# Patient Record
Sex: Male | Born: 1985 | Race: Black or African American | Hispanic: No | Marital: Single | Smoking: Former smoker
Health system: Southern US, Community
[De-identification: ages and names within clinical notes are randomized; demographics above are authoritative.]

## PROBLEM LIST (undated history)

## (undated) DIAGNOSIS — J40 Bronchitis, not specified as acute or chronic: Secondary | ICD-10-CM

---
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
## 2009-01-18 ENCOUNTER — Emergency Department (HOSPITAL_COMMUNITY): Admission: EM | Admit: 2009-01-18 | Discharge: 2009-01-18 | Payer: Self-pay | Admitting: Emergency Medicine

## 2010-05-05 LAB — RAPID STREP SCREEN (MED CTR MEBANE ONLY): Streptococcus, Group A Screen (Direct): NEGATIVE

## 2012-07-12 ENCOUNTER — Encounter (HOSPITAL_COMMUNITY): Payer: Self-pay

## 2012-07-12 ENCOUNTER — Emergency Department (INDEPENDENT_AMBULATORY_CARE_PROVIDER_SITE_OTHER): Payer: BC Managed Care – PPO

## 2012-07-12 ENCOUNTER — Emergency Department (HOSPITAL_COMMUNITY)
Admission: EM | Admit: 2012-07-12 | Discharge: 2012-07-12 | Disposition: A | Discharge status: Home / Self Care | Payer: BC Managed Care – PPO | Source: Home / Self Care | Attending: Emergency Medicine | Admitting: Emergency Medicine

## 2012-07-12 DIAGNOSIS — R0789 Other chest pain: Secondary | ICD-10-CM

## 2012-07-12 DIAGNOSIS — J40 Bronchitis, not specified as acute or chronic: Secondary | ICD-10-CM

## 2012-07-12 DIAGNOSIS — R071 Chest pain on breathing: Secondary | ICD-10-CM

## 2012-07-12 MED ORDER — IBUPROFEN 800 MG PO TABS: 800.0000 mg | ORAL_TABLET | Freq: Three times a day (TID) | ORAL | Status: DC

## 2012-07-12 MED ORDER — ALBUTEROL SULFATE HFA 108 (90 BASE) MCG/ACT IN AERS: 2.0000 | INHALATION_SPRAY | RESPIRATORY_TRACT | Status: DC | PRN

## 2012-07-12 NOTE — ED Provider Notes (Signed)
Medical screening examination/treatment/procedure(s) were performed by non-physician practitioner and as supervising physician I was immediately available for consultation/collaboration.  Raynald Blend, MD 07/12/12 1346

## 2012-07-12 NOTE — ED Provider Notes (Signed)
History     CSN: [id number]  Arrival date & time 07/12/12  1149   First MD Initiated Contact with Patient 07/12/12 1306      Chief Complaint  Patient presents with  . Muscle Pain    (Consider location/radiation/quality/duration/timing/severity/associated sxs/prior treatment) Patient is a 27 y.o. male presenting with cough. The history is provided by the patient. No language interpreter was used.  Cough Cough characteristics:  Productive Sputum characteristics:  Nondescript Severity:  Mild Timing:  Constant Progression:  Worsening Chronicity:  New Relieved by:  Nothing Worsened by:  Nothing tried Associated symptoms: chest pain   Pt reports that he is having soreness and pain in his chest.  Full chest, achy,  phelgm in am.  Pt is a smoker.  History reviewed. No pertinent past medical history.  History reviewed. No pertinent past surgical history.  History reviewed. No pertinent family history.  History  Substance Use Topics  . Smoking status: Not on file  . Smokeless tobacco: Not on file  . Alcohol Use: Not on file      Review of Systems  Respiratory: Positive for cough.   Cardiovascular: Positive for chest pain.  All other systems reviewed and are negative.    Allergies  Review of patient's allergies indicates no known allergies.  Home Medications  No current outpatient prescriptions on file.  BP 122/79  Pulse 84  Temp(Src) 98.6 F (37 C) (Oral)  Resp 16  SpO2 100%  Physical Exam  Nursing note and vitals reviewed. Constitutional: He is oriented to person, place, and time. He appears well-developed and well-nourished.  HENT:  Head: Normocephalic and atraumatic.  Right Ear: External ear normal.  Left Ear: External ear normal.  Mouth/Throat: Oropharynx is clear and moist.  Eyes: Conjunctivae and EOM are normal. Pupils are equal, round, and reactive to light.  Neck: Normal range of motion. Neck supple.  Cardiovascular: Normal rate and normal heart  sounds.   Pulmonary/Chest: Effort normal and breath sounds normal.  Musculoskeletal: Normal range of motion.  Neurological: He is alert and oriented to person, place, and time. He has normal reflexes.  Skin: Skin is warm.  Psychiatric: He has a normal mood and affect.    ED Course  Procedures (including critical care time)  Labs Reviewed - No data to display No results found.  Chest xray normal,   Possible viral bronchitis,   I will treat with ibuprofen and albuterol inhaler. 1. Bronchitis   2. Chest wall pain       MDM       Elson Areas, PA-C 07/12/12 1336  Lonia Skinner [place], [place] 07/12/12 1341

## 2012-07-12 NOTE — ED Notes (Signed)
C/o 3 day duration of pain in sternal/epigastric area , some worse w direct palpation or deep breathing, some better w burping ; worse when he lays down. Denies SOB, sweats, radiation of discomfort, dizzy ; W/D/colr good, NAD

## 2013-03-02 ENCOUNTER — Emergency Department (HOSPITAL_COMMUNITY)
Admission: EM | Admit: 2013-03-02 | Discharge: 2013-03-02 | Disposition: A | Discharge status: Home / Self Care | Payer: BC Managed Care – PPO | Attending: Emergency Medicine | Admitting: Emergency Medicine

## 2013-03-02 ENCOUNTER — Emergency Department (HOSPITAL_COMMUNITY): Payer: BC Managed Care – PPO

## 2013-03-02 ENCOUNTER — Encounter (HOSPITAL_COMMUNITY): Payer: Self-pay | Admitting: Emergency Medicine

## 2013-03-02 DIAGNOSIS — R141 Gas pain: Secondary | ICD-10-CM | POA: Insufficient documentation

## 2013-03-02 DIAGNOSIS — R142 Eructation: Secondary | ICD-10-CM | POA: Insufficient documentation

## 2013-03-02 DIAGNOSIS — R079 Chest pain, unspecified: Secondary | ICD-10-CM | POA: Insufficient documentation

## 2013-03-02 DIAGNOSIS — Z8709 Personal history of other diseases of the respiratory system: Secondary | ICD-10-CM | POA: Insufficient documentation

## 2013-03-02 DIAGNOSIS — Z87891 Personal history of nicotine dependence: Secondary | ICD-10-CM | POA: Insufficient documentation

## 2013-03-02 DIAGNOSIS — R143 Flatulence: Secondary | ICD-10-CM

## 2013-03-02 DIAGNOSIS — F411 Generalized anxiety disorder: Secondary | ICD-10-CM | POA: Insufficient documentation

## 2013-03-02 HISTORY — DX: Bronchitis, not specified as acute or chronic: J40

## 2013-03-02 MED ORDER — ONDANSETRON 4 MG PO TBDP
4.0000 mg | ORAL_TABLET | Freq: Once | ORAL | Status: AC
  Administered 2013-03-02: 4 mg via ORAL
  Filled 2013-03-02: qty 1

## 2013-03-02 MED ORDER — GI COCKTAIL ~~LOC~~
30.0000 mL | Freq: Once | ORAL | Status: AC
  Administered 2013-03-02: 30 mL via ORAL
  Filled 2013-03-02: qty 30

## 2013-03-02 MED ORDER — FAMOTIDINE 20 MG PO TABS: 20.0000 mg | ORAL_TABLET | Freq: Two times a day (BID) | ORAL | Status: DC

## 2013-03-02 MED ORDER — HYDROMORPHONE HCL PF 1 MG/ML IJ SOLN
1.0000 mg | Freq: Once | INTRAMUSCULAR | Status: AC
  Administered 2013-03-02: 1 mg via INTRAMUSCULAR
  Filled 2013-03-02: qty 1

## 2013-03-02 NOTE — Discharge Instructions (Signed)
Chest Pain (Nonspecific) °It is often hard to give a specific diagnosis for the cause of chest pain. There is always a chance that your pain could be related to something serious, such as a heart attack or a blood clot in the lungs. You need to follow up with your caregiver for further evaluation. °CAUSES  °· Heartburn. °· Pneumonia or bronchitis. °· Anxiety or stress. °· Inflammation around your heart (pericarditis) or lung (pleuritis or pleurisy). °· A blood clot in the lung. °· A collapsed lung (pneumothorax). It can develop suddenly on its own (spontaneous pneumothorax) or from injury (trauma) to the chest. °· Shingles infection (herpes zoster virus). °The chest wall is composed of bones, muscles, and cartilage. Any of these can be the source of the pain. °· The bones can be bruised by injury. °· The muscles or cartilage can be strained by coughing or overwork. °· The cartilage can be affected by inflammation and become sore (costochondritis). °DIAGNOSIS  °Lab tests or other studies, such as X-rays, electrocardiography, stress testing, or cardiac imaging, may be needed to find the cause of your pain.  °TREATMENT  °· Treatment depends on what may be causing your chest pain. Treatment may include: °· Acid blockers for heartburn. °· Anti-inflammatory medicine. °· Pain medicine for inflammatory conditions. °· Antibiotics if an infection is present. °· You may be advised to change lifestyle habits. This includes stopping smoking and avoiding alcohol, caffeine, and chocolate. °· You may be advised to keep your head raised (elevated) when sleeping. This reduces the chance of acid going backward from your stomach into your esophagus. °· Most of the time, nonspecific chest pain will improve within 2 to 3 days with rest and mild pain medicine. °HOME CARE INSTRUCTIONS  °· If antibiotics were prescribed, take your antibiotics as directed. Finish them even if you start to feel better. °· For the next few days, avoid physical  activities that bring on chest pain. Continue physical activities as directed. °· Do not smoke. °· Avoid drinking alcohol. °· Only take over-the-counter or prescription medicine for pain, discomfort, or fever as directed by your caregiver. °· Follow your caregiver's suggestions for further testing if your chest pain does not go away. °· Keep any follow-up appointments you made. If you do not go to an appointment, you could develop lasting (chronic) problems with pain. If there is any problem keeping an appointment, you must call to reschedule. °SEEK MEDICAL CARE IF:  °· You think you are having problems from the medicine you are taking. Read your medicine instructions carefully. °· Your chest pain does not go away, even after treatment. °· You develop a rash with blisters on your chest. °SEEK IMMEDIATE MEDICAL CARE IF:  °· You have increased chest pain or pain that spreads to your arm, neck, jaw, back, or abdomen. °· You develop shortness of breath, an increasing cough, or you are coughing up blood. °· You have severe back or abdominal pain, feel nauseous, or vomit. °· You develop severe weakness, fainting, or chills. °· You have a fever. °THIS IS AN EMERGENCY. Do not wait to see if the pain will go away. Get medical help at once. Call your local emergency services (911 in [place] Do not drive yourself to the hospital. °MAKE SURE YOU:  °· Understand these instructions. °· Will watch your condition. °· Will get help right away if you are not doing well or get worse. °Document Released: 10/29/2004 Document Revised: 04/13/2011 Document Reviewed: 08/25/2007 °[employer]® Patient Information ©2014 [employer]   [employer]. ° °

## 2013-03-02 NOTE — ED Provider Notes (Signed)
CSN: [id number]     Arrival date & time 03/02/13  0446 History   First MD Initiated Contact with Patient 03/02/13 0541     Chief Complaint  Patient presents with  . Chest Pain   (Consider location/radiation/quality/duration/timing/severity/associated sxs/prior Treatment) HPI History provided by patient. Sharp epigastric pain onset while sleeping radiates to mid chest. Has associated belching and sour taste in his mouth. Patient thought this was indigestion and took some TUMS and Gas-X without relief. He became concerned when the symptoms persisted and presents here for evaluation. He had been smoking marijuana and admits to some anxiety about his symptoms. No cough. No shortness of breath. No fevers. No back pain. No history of same.  Past Medical History  Diagnosis Date  . Bronchitis    History reviewed. No pertinent past surgical history. No family history on file. History  Substance Use Topics  . Smoking status: Former Games developermoker  . Smokeless tobacco: Never Used  . Alcohol Use: 4.2 oz/week    7 Cans of beer per week    Review of Systems  Constitutional: Negative for fever and chills.  Eyes: Negative for visual disturbance.  Respiratory: Negative for cough and wheezing.   Cardiovascular: Positive for chest pain.  Gastrointestinal: Negative for vomiting and blood in stool.  Musculoskeletal: Negative for back pain.  Skin: Negative for rash.  Neurological: Negative for weakness and headaches.  All other systems reviewed and are negative.    Allergies  Review of patient's allergies indicates no known allergies.  Home Medications   Current Outpatient Rx  Name  Route  Sig  Dispense  Refill  . albuterol (PROVENTIL HFA;VENTOLIN HFA) 108 (90 BASE) MCG/ACT inhaler   Inhalation   Inhale 2 puffs into the lungs every 4 (four) hours as needed for wheezing.   1 Inhaler   0    BP 107/91  Pulse 75  Temp(Src) 98 F (36.7 C) (Oral)  Resp 17  Ht 5\' 10"  (1.778 m)  Wt 175 lb (79.379  kg)  BMI 25.11 kg/m2  SpO2 100% Physical Exam  Constitutional: He is oriented to person, place, and time. He appears well-developed and well-nourished.  HENT:  Head: Normocephalic and atraumatic.  Mouth/Throat: Oropharynx is clear and moist. No oropharyngeal exudate.  Eyes: Conjunctivae and EOM are normal. Pupils are equal, round, and reactive to light.  Neck: Trachea normal. Neck supple. No thyromegaly present.  Cardiovascular: Normal rate, regular rhythm, S1 normal, S2 normal and normal pulses.     No systolic murmur is present   No diastolic murmur is present  Pulses:      Radial pulses are 2+ on the right side, and 2+ on the left side.  Pulmonary/Chest: Effort normal and breath sounds normal. He has no wheezes. He has no rhonchi. He has no rales. He exhibits no tenderness.  Abdominal: Soft. Normal appearance and bowel sounds are normal. There is no tenderness. There is no CVA tenderness and negative Murphy's sign.  Musculoskeletal:  Calves nontender, no cords or erythema  Neurological: He is alert and oriented to person, place, and time. He has normal strength. No cranial nerve deficit or sensory deficit. GCS eye subscore is 4. GCS verbal subscore is 5. GCS motor subscore is 6.  Skin: Skin is warm and dry. No rash noted. He is not diaphoretic.  Psychiatric: His speech is normal.  Cooperative and appropriate    ED Course  Procedures (including critical care time) Labs Review Labs Reviewed - No data to display Imaging Review  Dg Chest 2 View  03/02/2013   CLINICAL DATA:  Sudden onset of mid chest pain this morning.  EXAM: CHEST  2 VIEW  COMPARISON:  07/12/2012  FINDINGS: Heart size and vascularity are normal and the lungs are clear. No acute osseous abnormality. Slight thoracolumbar scoliosis, unchanged.  IMPRESSION: No acute abnormality.   Electronically Signed   By: Geanie Cooley M.D.   On: 03/02/2013 06:57    EKG Interpretation    Date/Time:  Thursday March 02 2013 04:54:03  EST Ventricular Rate:  74 PR Interval:  152 QRS Duration: 85 QT Interval:  382 QTC Calculation: 424 R Axis:   64 Text Interpretation:  Sinus rhythm Nonspecific ST abnormality No old tracing to compare Confirmed by Dot Splinter  MD, Zymere Patlan ([id number]) on 03/02/2013 5:42:14 AM           GI cocktail and Zofran/ IM Dilaudid provided  On recheck is pain free and wants to go home, is low risk for ACS. Will treat will pepcid. PT agress to strict return precautions and is stable and appropriate for discharge at this time.   MDM  Diagnosis: Chest pain  Presentation suggests GERD Evaluated with screening EKG reviewed as above, no acute ischemia. Chest x-ray obtained and reviewed no pneumothorax and no infiltrate. Symptomatically improved with GI cocktail and medications Vital signs and nursing notes reviewed and considered   Sunnie Nielsen, MD 03/03/13 0001

## 2013-03-02 NOTE — ED Notes (Signed)
Pt states that when he awoke he took gas-x and 2 tums trying to relieve the pain with some relief.

## 2013-03-02 NOTE — ED Notes (Signed)
Pt arrives ambulatory via EMS c/o chest pain when woke up. Starts in epigastric and radiates up. Pain 8/10. VSS. EKG unremarkable. NSR.  Hx bronchitis last year. States he smoked marijuana last night. Pain increased with palpation.

## 2013-04-06 ENCOUNTER — Encounter (HOSPITAL_COMMUNITY): Payer: Self-pay | Admitting: Emergency Medicine

## 2013-04-06 ENCOUNTER — Emergency Department (HOSPITAL_COMMUNITY): Payer: BC Managed Care – PPO

## 2013-04-06 ENCOUNTER — Emergency Department (HOSPITAL_COMMUNITY)
Admission: EM | Admit: 2013-04-06 | Discharge: 2013-04-06 | Disposition: A | Discharge status: Home / Self Care | Payer: BC Managed Care – PPO | Attending: Emergency Medicine | Admitting: Emergency Medicine

## 2013-04-06 DIAGNOSIS — J069 Acute upper respiratory infection, unspecified: Secondary | ICD-10-CM | POA: Insufficient documentation

## 2013-04-06 DIAGNOSIS — Z79899 Other long term (current) drug therapy: Secondary | ICD-10-CM | POA: Insufficient documentation

## 2013-04-06 DIAGNOSIS — Z87891 Personal history of nicotine dependence: Secondary | ICD-10-CM | POA: Insufficient documentation

## 2013-04-06 MED ORDER — HYDROCODONE-HOMATROPINE 5-1.5 MG/5ML PO SYRP: 5.0000 mL | ORAL_SOLUTION | Freq: Four times a day (QID) | ORAL | Status: DC | PRN

## 2013-04-06 MED ORDER — IBUPROFEN 800 MG PO TABS: 800.0000 mg | ORAL_TABLET | Freq: Three times a day (TID) | ORAL | Status: DC

## 2013-04-06 NOTE — ED Provider Notes (Signed)
CSN: [id number]     Arrival date & time 04/06/13  1800 History  This chart was scribed for non-physician practitioner Emilia BeckKaitlyn Maryiah Olvey, PA-C working with Layla MawKristen N Ward, DO by Joaquin MusicKristina Sanchez-Matthews, ED Scribe. This patient was seen in room TR05C/TR05C and the patient's care was started at 9:20 PM .   Chief Complaint  Patient presents with  . Influenza   Patient is a 28 y.o. male presenting with flu symptoms. The history is provided by the patient. No language interpreter was used.  Influenza Presenting symptoms: cough, fever and sore throat    HPI Comments: Stephen Warner is a 28 y.o. male who presents to the Emergency Department complaining of ongoing worsening sore throat, fever, productive cough and emesis that began 3 days ago.Pt states he has progressively worsened since his sx initially began.  Pt states he has not had an episode of emesis since 3 days ago. He states he has been able to tolerate fluids. Pt states he has taken OTC Tylenol & Dayquil with little relief. Pt states he has recent sick contacts with his children. Pt denies having a sore throat at this moment.  Past Medical History  Diagnosis Date  . Bronchitis    History reviewed. No pertinent past surgical history. History reviewed. No pertinent family history. History  Substance Use Topics  . Smoking status: Former Games developermoker  . Smokeless tobacco: Never Used  . Alcohol Use: 4.2 oz/week    7 Cans of beer per week    Review of Systems  Constitutional: Positive for fever.  HENT: Positive for sore throat.   Respiratory: Positive for cough.   All other systems reviewed and are negative.    Allergies  Review of patient's allergies indicates no known allergies.  Home Medications   Current Outpatient Rx  Name  Route  Sig  Dispense  Refill  . acetaminophen (TYLENOL) 500 MG tablet   Oral   Take 1,000 mg by mouth every 6 (six) hours as needed for moderate pain.         Marland Kitchen. albuterol (PROVENTIL HFA;VENTOLIN HFA) 108  (90 BASE) MCG/ACT inhaler   Inhalation   Inhale 2 puffs into the lungs every 4 (four) hours as needed for wheezing.   1 Inhaler   0   . Aspirin Effervescent (ALKA-SELTZER PO)   Oral   Take 2 tablets by mouth once.          BP 128/73  Pulse 89  Temp(Src) 98.8 F (37.1 C) (Oral)  Resp 17  SpO2 100%  Physical Exam  Nursing note and vitals reviewed. Constitutional: He is oriented to person, place, and time. He appears well-developed and well-nourished. No distress.  HENT:  Head: Normocephalic and atraumatic.  Mouth/Throat: No oropharyngeal exudate.  Eyes: EOM are normal. Pupils are equal, round, and reactive to light.  Neck: Neck supple. No tracheal deviation present.  Cardiovascular: Normal rate, regular rhythm and normal heart sounds.  Exam reveals no gallop and no friction rub.   No murmur heard. Pulmonary/Chest: Effort normal and breath sounds normal. No respiratory distress. He has no wheezes. He has no rales. He exhibits no tenderness.  Musculoskeletal: Normal range of motion.  Neurological: He is alert and oriented to person, place, and time.  Skin: Skin is warm and dry.  Psychiatric: He has a normal mood and affect. His behavior is normal.    ED Course  Procedures  DIAGNOSTIC STUDIES: Oxygen Saturation is 100% on RA, normal by my interpretation.    COORDINATION OF  CARE: 9:24 PM-Discussed treatment plan which includes discuss radiology findings and will discharge pt with Ibuprofen and Hycodan. Pt agreed to plan.   Labs Review Labs Reviewed - No data to display Imaging Review Dg Chest 2 View  04/06/2013   CLINICAL DATA:  Cough for past week.  EXAM: CHEST  2 VIEW  COMPARISON:  03/02/2013.  FINDINGS: Minimal peribronchial thickening may be normal versus minimal bronchitis type changes.  No infiltrate, congestive heart failure or pneumothorax.  Heart size within normal limits.  Mild curvature thoracic spine.  IMPRESSION: Minimal peribronchial thickening.  Please see  above.   Electronically Signed   By: Bridgett Larsson M.D.   On: 04/06/2013 20:24     EKG Interpretation None     MDM   Final diagnoses:  URI (upper respiratory infection)    9:29 PM Chest xray unremarkable for acute changes. Vitals stable and patient afebrile. Patient will be discharged with hycodan and ibuprofen for symptoms. Patient advised to return with worsening or concerning symptoms.   I personally performed the services described in this documentation, which was scribed in my presence. The recorded information has been reviewed and is accurate.    Emilia Beck, [place] 04/06/13 2130

## 2013-04-06 NOTE — ED Notes (Signed)
Per pt sts fever, sore throat, sts vomiting Tuesday. sts hasn't vomited since Tuesday. sts keeping fluids down and taking tylenol.

## 2013-04-06 NOTE — ED Provider Notes (Signed)
Medical screening examination/treatment/procedure(s) were performed by non-physician practitioner and as supervising physician I was immediately available for consultation/collaboration.   EKG Interpretation None        Kristen N Ward, DO 04/06/13 2340 

## 2013-04-06 NOTE — Discharge Instructions (Signed)
Take hycodan as needed for cough. Take ibuprofen as needed for fever. Refer to attached documents for more information.

## 2013-12-21 ENCOUNTER — Encounter (HOSPITAL_COMMUNITY): Payer: Self-pay | Admitting: *Deleted

## 2013-12-21 ENCOUNTER — Emergency Department (HOSPITAL_COMMUNITY)
Admission: EM | Admit: 2013-12-21 | Discharge: 2013-12-21 | Disposition: A | Discharge status: Home / Self Care | Payer: BC Managed Care – PPO | Source: Home / Self Care | Attending: Family Medicine | Admitting: Family Medicine

## 2013-12-21 DIAGNOSIS — J0140 Acute pansinusitis, unspecified: Secondary | ICD-10-CM

## 2013-12-21 MED ORDER — FLUTICASONE PROPIONATE 50 MCG/ACT NA SUSP: 2.0000 | Freq: Every day | NASAL | Status: DC

## 2013-12-21 MED ORDER — AMOXICILLIN-POT CLAVULANATE 875-125 MG PO TABS: 1.0000 | ORAL_TABLET | Freq: Two times a day (BID) | ORAL | Status: DC

## 2013-12-21 MED ORDER — IPRATROPIUM BROMIDE 0.06 % NA SOLN: 2.0000 | Freq: Four times a day (QID) | NASAL | Status: DC

## 2013-12-21 NOTE — ED Notes (Signed)
C/o cold symptoms onset 1 week ago, but still has pressure around his eyes, headache, nasal congestion yellowish.  Had fever night before last.  He took 2 Tylenol.

## 2013-12-21 NOTE — ED Provider Notes (Signed)
CSN: [id number]     Arrival date & time 12/21/13  1533 History   None    Chief Complaint  Patient presents with  . URI   (Consider location/radiation/quality/duration/timing/severity/associated sxs/prior Treatment) HPI  9 days ago developed HA. Developed increased mucus production and sinus pain and pressure. Tylenol sinus w/o much benefit. Home remedies w/o benefit. Subjective fever. Denies CP, SOB, nausea, vomiting, diarrhea. Not getting better.     Past Medical History  Diagnosis Date  . Bronchitis    History reviewed. No pertinent past surgical history. History reviewed. No pertinent family history. History  Substance Use Topics  . Smoking status: Former Smoker    Types: Cigars    Quit date: 02/02/2013  . Smokeless tobacco: Never Used  . Alcohol Use: No    Review of Systems Per HPI with all other pertinent systems negative.   Allergies  Review of patient's allergies indicates no known allergies.  Home Medications   Prior to Admission medications   Medication Sig Start Date End Date Taking? Authorizing Provider  acetaminophen (TYLENOL) 500 MG tablet Take 1,000 mg by mouth every 6 (six) hours as needed for moderate pain.   Yes Historical Provider, MD  Aspirin Effervescent (ALKA-SELTZER PO) Take 2 tablets by mouth once.   Yes Historical Provider, MD  albuterol (PROVENTIL HFA;VENTOLIN HFA) 108 (90 BASE) MCG/ACT inhaler Inhale 2 puffs into the lungs every 4 (four) hours as needed for wheezing. 07/12/12   Elson AreasLeslie K Sofia, PA-C  amoxicillin-clavulanate (AUGMENTIN) 875-125 MG per tablet Take 1 tablet by mouth 2 (two) times daily. 12/21/13   Ozella Rocksavid J Merrell, MD  fluticasone (FLONASE) 50 MCG/ACT nasal spray Place 2 sprays into both nostrils at bedtime. 12/21/13   Ozella Rocksavid J Merrell, MD  HYDROcodone-homatropine [facility](HYCODAN) 5-1.5 MG/5ML syrup Take 5 mLs by mouth every 6 (six) hours as needed. 04/06/13   Kaitlyn Szekalski, PA-C  ibuprofen (ADVIL,MOTRIN) 800 MG tablet Take 1 tablet (800 mg  total) by mouth 3 (three) times daily. 04/06/13   Kaitlyn Szekalski, PA-C  ipratropium (ATROVENT) 0.06 % nasal spray Place 2 sprays into both nostrils 4 (four) times daily. 12/21/13   Ozella Rocksavid J Merrell, MD   BP 117/67 mmHg  Pulse 80  Temp(Src) 98.1 F (36.7 C) (Oral)  Resp 12  SpO2 100% Physical Exam  Constitutional: He appears well-developed and well-nourished.  HENT:  Frontal and maxillary sinuses full  Eyes: EOM are normal. Pupils are equal, round, and reactive to light.  Neck: Normal range of motion.  Cardiovascular: Normal rate and normal heart sounds.   Pulmonary/Chest: Effort normal.  Abdominal: Soft. He exhibits no distension.  Musculoskeletal: Normal range of motion. He exhibits no tenderness.  Skin: Skin is warm and dry.  Psychiatric: He has a normal mood and affect. His behavior is normal. Thought content normal.    ED Course  Procedures (including critical care time) Labs Review Labs Reviewed - No data to display  Imaging Review No results found.   MDM   1. Acute pansinusitis, recurrence not specified    Augmentin Nasal atrovent Flonase  Motrin Precautions given and all questions answered  Shelly Flattenavid Merrell, MD Family Medicine 12/21/2013, 4:47 PM      Ozella Rocksavid J Merrell, MD 12/21/13 [phone]

## 2014-04-23 ENCOUNTER — Emergency Department (HOSPITAL_COMMUNITY)
Admission: EM | Admit: 2014-04-23 | Discharge: 2014-04-23 | Disposition: A | Discharge status: Home / Self Care | Payer: Self-pay | Attending: Emergency Medicine | Admitting: Emergency Medicine

## 2014-04-23 ENCOUNTER — Encounter (HOSPITAL_COMMUNITY): Payer: Self-pay | Admitting: Family Medicine

## 2014-04-23 ENCOUNTER — Emergency Department (HOSPITAL_COMMUNITY): Payer: Self-pay

## 2014-04-23 DIAGNOSIS — R55 Syncope and collapse: Secondary | ICD-10-CM | POA: Insufficient documentation

## 2014-04-23 DIAGNOSIS — Z8709 Personal history of other diseases of the respiratory system: Secondary | ICD-10-CM | POA: Insufficient documentation

## 2014-04-23 DIAGNOSIS — Z79899 Other long term (current) drug therapy: Secondary | ICD-10-CM | POA: Insufficient documentation

## 2014-04-23 DIAGNOSIS — R002 Palpitations: Secondary | ICD-10-CM | POA: Insufficient documentation

## 2014-04-23 DIAGNOSIS — Z87891 Personal history of nicotine dependence: Secondary | ICD-10-CM | POA: Insufficient documentation

## 2014-04-23 LAB — I-STAT TROPONIN, ED: Troponin i, poc: 0 ng/mL (ref 0.00–0.08)

## 2014-04-23 LAB — CBC
HEMATOCRIT: 41.3 % (ref 39.0–52.0)
Hemoglobin: 13.4 g/dL (ref 13.0–17.0)
MCH: 29.8 pg (ref 26.0–34.0)
MCHC: 32.4 g/dL (ref 30.0–36.0)
MCV: 91.8 fL (ref 78.0–100.0)
Platelets: 260 10*3/uL (ref 150–400)
RBC: 4.5 MIL/uL (ref 4.22–5.81)
RDW: 11.7 % (ref 11.5–15.5)
WBC: 5.3 10*3/uL (ref 4.0–10.5)

## 2014-04-23 LAB — BASIC METABOLIC PANEL
ANION GAP: 8 (ref 5–15)
BUN: 16 mg/dL (ref 6–23)
CALCIUM: 9.2 mg/dL (ref 8.4–10.5)
CHLORIDE: 106 mmol/L (ref 96–112)
CO2: 26 mmol/L (ref 19–32)
Creatinine, Ser: 0.98 mg/dL (ref 0.50–1.35)
GFR calc Af Amer: >90 mL/min (ref >90)
GFR calc non Af Amer: >90 mL/min (ref >90)
Glucose, Bld: 87 mg/dL (ref 70–99)
POTASSIUM: 3.8 mmol/L (ref 3.5–5.1)
Sodium: 140 mmol/L (ref 135–145)

## 2014-04-23 NOTE — ED Notes (Signed)
Per pt sts he was walking into the kitchen last night and had a syncopal episode. sts wife found him in the kitchen. sts he hit had on the floor when he fell. sts his heart was beating fast.

## 2014-04-23 NOTE — ED Notes (Signed)
Pt transported to xray 

## 2014-04-23 NOTE — ED Provider Notes (Signed)
CSN: [id number]     Arrival date & time 04/23/14  1628 History   First MD Initiated Contact with Patient 04/23/14 1652     Chief Complaint  Patient presents with  . Loss of Consciousness  . Fall     (Consider location/radiation/quality/duration/timing/severity/associated sxs/prior Treatment) Patient is a 29 y.o. male presenting with syncope. The history is provided by the patient.  Loss of Consciousness Episode history:  Single Most recent episode:  Yesterday Timing:  Intermittent Progression:  Resolved Chronicity:  Recurrent Context comment:  After palpitations Witnessed: yes   Relieved by:  None tried Worsened by:  Nothing tried Ineffective treatments:  None tried Associated symptoms: palpitations   Associated symptoms: no chest pain and no shortness of breath   Risk factors: no coronary artery disease and no seizures     Past Medical History  Diagnosis Date  . Bronchitis    History reviewed. No pertinent past surgical history. History reviewed. No pertinent family history. History  Substance Use Topics  . Smoking status: Former Smoker    Types: Cigars    Quit date: 02/02/2013  . Smokeless tobacco: Never Used  . Alcohol Use: No    Review of Systems  Respiratory: Negative for cough, chest tightness and shortness of breath.   Cardiovascular: Positive for palpitations and syncope. Negative for chest pain.  All other systems reviewed and are negative.     Allergies  Review of patient's allergies indicates no known allergies.  Home Medications   Prior to Admission medications   Medication Sig Start Date End Date Taking? Authorizing Provider  acetaminophen (TYLENOL) 500 MG tablet Take 1,000 mg by mouth every 6 (six) hours as needed for moderate pain.    Historical Provider, MD  albuterol (PROVENTIL HFA;VENTOLIN HFA) 108 (90 BASE) MCG/ACT inhaler Inhale 2 puffs into the lungs every 4 (four) hours as needed for wheezing. 07/12/12   Elson AreasLeslie K Sofia, PA-C   amoxicillin-clavulanate (AUGMENTIN) 875-125 MG per tablet Take 1 tablet by mouth 2 (two) times daily. 12/21/13   Ozella Rocksavid J Merrell, MD  Aspirin Effervescent (ALKA-SELTZER PO) Take 2 tablets by mouth once.    Historical Provider, MD  fluticasone (FLONASE) 50 MCG/ACT nasal spray Place 2 sprays into both nostrils at bedtime. 12/21/13   Ozella Rocksavid J Merrell, MD  HYDROcodone-homatropine [facility](HYCODAN) 5-1.5 MG/5ML syrup Take 5 mLs by mouth every 6 (six) hours as needed. 04/06/13   Kaitlyn Szekalski, PA-C  ibuprofen (ADVIL,MOTRIN) 800 MG tablet Take 1 tablet (800 mg total) by mouth 3 (three) times daily. 04/06/13   Kaitlyn Szekalski, PA-C  ipratropium (ATROVENT) 0.06 % nasal spray Place 2 sprays into both nostrils 4 (four) times daily. 12/21/13   Ozella Rocksavid J Merrell, MD   BP 113/76 mmHg  Pulse 79  Temp(Src) 97.8 F (36.6 C) (Oral)  Resp 21  Ht 5\' 10"  (1.778 m)  Wt 175 lb 3.2 oz (79.47 kg)  BMI 25.14 kg/m2  SpO2 96% Physical Exam  Constitutional: He is oriented to person, place, and time. He appears well-developed and well-nourished. No distress.  HENT:  Head: Normocephalic and atraumatic.  Mouth/Throat: Oropharynx is clear and moist. No oropharyngeal exudate.  Eyes: Conjunctivae and EOM are normal. Pupils are equal, round, and reactive to light.  Neck: Normal range of motion. Neck supple.  Cardiovascular: Normal rate, regular rhythm, normal heart sounds and intact distal pulses.  Exam reveals no gallop and no friction rub.   No murmur heard. Pulmonary/Chest: Effort normal and breath sounds normal. No respiratory distress. He has no  wheezes. He has no rales.  Abdominal: Soft. He exhibits no distension and no mass. There is no tenderness. There is no rebound and no guarding.  Musculoskeletal: Normal range of motion. He exhibits no edema or tenderness.  Lymphadenopathy:    He has no cervical adenopathy.  Neurological: He is alert and oriented to person, place, and time. No cranial nerve deficit.  Skin: Skin is  warm and dry. No rash noted. He is not diaphoretic.  Psychiatric: He has a normal mood and affect. His behavior is normal. Judgment and thought content normal.  Nursing note and vitals reviewed.   ED Course  Procedures (including critical care time) Labs Review Labs Reviewed  CBC  BASIC METABOLIC PANEL  Rosezena Sensor, ED    Imaging Review Dg Chest 2 View  04/23/2014   CLINICAL DATA:  Tachycardia and syncope for 1 day.  EXAM: CHEST  2 VIEW  COMPARISON:  04/06/2013  FINDINGS: The heart size and mediastinal contours are within normal limits. Both lungs are clear. The visualized skeletal structures are unremarkable.  IMPRESSION: Normal chest x-ray.   Electronically Signed   By: Rudie Meyer M.D.   On: 04/23/2014 18:21     EKG Interpretation   Date/Time:  Monday April 23 2014 16:42:01 EDT Ventricular Rate:  89 PR Interval:  130 QRS Duration: 86 QT Interval:  348 QTC Calculation: 423 R Axis:   71 Text Interpretation:  Normal sinus rhythm T wave abnormality, consider  inferior ischemia Abnormal ECG change from previous tracing Confirmed by  BEATON  MD, ROBERT ([id number]) on 04/23/2014 4:45:59 PM      MDM   Final diagnoses:  Syncope, unspecified syncope type    29 year old male presents with syncope concerning for cardiogenic etiology. He has had multiple episodes over the last few years preceded by palpitations which led to shortness of breath and dizziness. He then syncopized. Last episode was yesterday. No obvious traumatic injuries and he has no symptoms today. Given 24 hours of observation and nonfocal neuro exam, no imaging performed. All signs are reassuring, EKG without arrhythmia, ischemia, change in intervals, long QTC, Brugada. No appreciable heart murmurs. No evidence of HOCM on EKG. The patient needs cardiology follow-up and likely event monitor. Will discuss with cardiology to arrange.  Laboratory workup reassuring. Chest x-ray without consolidation, effusion, widened  mediastinum, pneumothorax. Discussed with cardiology and we will set up follow-up for further workup and event monitoring. Referral given.  Dorna Leitz, MD 04/23/14 Barry Brunner  Nelva Nay, MD 04/28/14 [phone]

## 2014-04-23 NOTE — Discharge Instructions (Signed)
Syncope °Syncope is a medical term for fainting or passing out. This means you lose consciousness and drop to the ground. People are generally unconscious for less than 5 minutes. You may have some muscle twitches for up to 15 seconds before waking up and returning to normal. Syncope occurs more often in older adults, but it can happen to anyone. While most causes of syncope are not dangerous, syncope can be a sign of a serious medical problem. It is important to seek medical care.  °CAUSES  °Syncope is caused by a sudden drop in blood flow to the brain. The specific cause is often not determined. Factors that can bring on syncope include: °· Taking medicines that lower blood pressure. °· Sudden changes in posture, such as standing up quickly. °· Taking more medicine than prescribed. °· Standing in one place for too long. °· Seizure disorders. °· Dehydration and excessive exposure to heat. °· Low blood sugar (hypoglycemia). °· Straining to have a bowel movement. °· Heart disease, irregular heartbeat, or other circulatory problems. °· Fear, emotional distress, seeing blood, or severe pain. °SYMPTOMS  °Right before fainting, you may: °· Feel dizzy or light-headed. °· Feel nauseous. °· See all white or all black in your field of vision. °· Have cold, clammy skin. °DIAGNOSIS  °Your health care provider will ask about your symptoms, perform a physical exam, and perform an electrocardiogram (ECG) to record the electrical activity of your heart. Your health care provider may also perform other heart or blood tests to determine the cause of your syncope which may include: °· Transthoracic echocardiogram (TTE). During echocardiography, sound waves are used to evaluate how blood flows through your heart. °· Transesophageal echocardiogram (TEE). °· Cardiac monitoring. This allows your health care provider to monitor your heart rate and rhythm in real time. °· Holter monitor. This is a portable device that records your  heartbeat and can help diagnose heart arrhythmias. It allows your health care provider to track your heart activity for several days, if needed. °· Stress tests by exercise or by giving medicine that makes the heart beat faster. °TREATMENT  °In most cases, no treatment is needed. Depending on the cause of your syncope, your health care provider may recommend changing or stopping some of your medicines. °HOME CARE INSTRUCTIONS °· Have someone stay with you until you feel stable. °· Do not drive, use machinery, or play sports until your health care provider says it is okay. °· Keep all follow-up appointments as directed by your health care provider. °· Lie down right away if you start feeling like you might faint. Breathe deeply and steadily. Wait until all the symptoms have passed. °· Drink enough fluids to keep your urine clear or pale yellow. °· If you are taking blood pressure or heart medicine, get up slowly and take several minutes to sit and then stand. This can reduce dizziness. °SEEK IMMEDIATE MEDICAL CARE IF:  °· You have a severe headache. °· You have unusual pain in the chest, abdomen, or back. °· You are bleeding from your mouth or rectum, or you have black or tarry stool. °· You have an irregular or very fast heartbeat. °· You have pain with breathing. °· You have repeated fainting or seizure-like jerking during an episode. °· You faint when sitting or lying down. °· You have confusion. °· You have trouble walking. °· You have severe weakness. °· You have vision problems. °If you fainted, call your local emergency services (911 in [place] Do not drive   yourself to the hospital.  °MAKE SURE YOU: °· Understand these instructions. °· Will watch your condition. °· Will get help right away if you are not doing well or get worse. °Document Released: 01/19/2005 Document Revised: 01/24/2013 Document Reviewed: 03/20/2011 °[employer]® Patient Information ©2015 [employer]. This information is not intended to replace  advice given to you by your health care provider. Make sure you discuss any questions you have with your health care provider. ° °

## 2014-06-07 ENCOUNTER — Encounter: Payer: Self-pay | Admitting: Cardiovascular Disease

## 2014-06-07 NOTE — Progress Notes (Signed)
Opened in error

## 2014-06-18 ENCOUNTER — Encounter: Payer: Self-pay | Admitting: Cardiovascular Disease

## 2014-09-25 ENCOUNTER — Emergency Department (HOSPITAL_COMMUNITY)
Admission: EM | Admit: 2014-09-25 | Discharge: 2014-09-25 | Disposition: A | Discharge status: Home / Self Care | Payer: Self-pay | Attending: Emergency Medicine | Admitting: Emergency Medicine

## 2014-09-25 ENCOUNTER — Encounter (HOSPITAL_COMMUNITY): Payer: Self-pay | Admitting: Emergency Medicine

## 2014-09-25 ENCOUNTER — Emergency Department (HOSPITAL_COMMUNITY): Payer: Self-pay

## 2014-09-25 DIAGNOSIS — Y998 Other external cause status: Secondary | ICD-10-CM | POA: Insufficient documentation

## 2014-09-25 DIAGNOSIS — Z79899 Other long term (current) drug therapy: Secondary | ICD-10-CM | POA: Insufficient documentation

## 2014-09-25 DIAGNOSIS — W228XXA Striking against or struck by other objects, initial encounter: Secondary | ICD-10-CM | POA: Insufficient documentation

## 2014-09-25 DIAGNOSIS — Y9389 Activity, other specified: Secondary | ICD-10-CM | POA: Insufficient documentation

## 2014-09-25 DIAGNOSIS — Y9289 Other specified places as the place of occurrence of the external cause: Secondary | ICD-10-CM | POA: Insufficient documentation

## 2014-09-25 DIAGNOSIS — S60412A Abrasion of right middle finger, initial encounter: Secondary | ICD-10-CM | POA: Insufficient documentation

## 2014-09-25 DIAGNOSIS — Z7982 Long term (current) use of aspirin: Secondary | ICD-10-CM | POA: Insufficient documentation

## 2014-09-25 DIAGNOSIS — Z87891 Personal history of nicotine dependence: Secondary | ICD-10-CM | POA: Insufficient documentation

## 2014-09-25 DIAGNOSIS — Z8709 Personal history of other diseases of the respiratory system: Secondary | ICD-10-CM | POA: Insufficient documentation

## 2014-09-25 DIAGNOSIS — S60031A Contusion of right middle finger without damage to nail, initial encounter: Secondary | ICD-10-CM | POA: Insufficient documentation

## 2014-09-25 MED ORDER — CEPHALEXIN 500 MG PO CAPS: 500.0000 mg | ORAL_CAPSULE | Freq: Four times a day (QID) | ORAL | Status: DC

## 2014-09-25 MED ORDER — IBUPROFEN 400 MG PO TABS
800.0000 mg | ORAL_TABLET | Freq: Once | ORAL | Status: AC
  Administered 2014-09-25: 800 mg via ORAL
  Filled 2014-09-25: qty 2

## 2014-09-25 MED ORDER — IBUPROFEN 800 MG PO TABS: 800.0000 mg | ORAL_TABLET | Freq: Three times a day (TID) | ORAL | Status: DC

## 2014-09-25 MED ORDER — BACITRACIN ZINC 500 UNIT/GM EX OINT: 1.0000 "application " | TOPICAL_OINTMENT | Freq: Two times a day (BID) | CUTANEOUS | Status: DC

## 2014-09-25 NOTE — ED Provider Notes (Signed)
CSN: [id number]     Arrival date & time 09/25/14  1416 History  This chart was scribed for non-physician practitioner, Dierdre Forth, PA-C working with Melene Plan, DO, by Jarvis Morgan, ED Scribe. This patient was seen in room TR11C/TR11C and the patient's care was started at 2:48 PM.     Chief Complaint  Patient presents with  . Hand Pain    The history is provided by the patient and medical records. No language interpreter was used.    HPI Comments: Stephen Warner is a 29 y.o. male who presents to the Emergency Department complaining of constant, moderate, gradually worsening, right middle finger pain onset 5 days ago. Pt states he was cleaning his dishwasher and he shut his right hand in the dishwasher. Pt reports associated swelling to the tip of his right middle finger and redness. He notes he has a small abrasion over the DIP joint of his right hand. There is no active bleeding. He has been applying peroxide to the area with no relief. He has not taken anything for the pain. Pt is not on any anticoagulants. He denies any fever, chills, nausea, numbness, weakness, or sensation loss.    Past Medical History  Diagnosis Date  . Bronchitis    History reviewed. No pertinent past surgical history. History reviewed. No pertinent family history. Social History  Substance Use Topics  . Smoking status: Former Smoker    Types: Cigars    Quit date: 02/02/2013  . Smokeless tobacco: Never Used  . Alcohol Use: No    Review of Systems  Constitutional: Negative for fever and chills.  Gastrointestinal: Negative for nausea and vomiting.  Musculoskeletal: Positive for myalgias, joint swelling and arthralgias. Negative for back pain, neck pain and neck stiffness.  Skin: Positive for color change and wound (small abrasion over DIP joint of right middle finger).  Neurological: Negative for numbness.  Hematological: Does not bruise/bleed easily.  Psychiatric/Behavioral: The patient is not  nervous/anxious.   All other systems reviewed and are negative.     Allergies  Review of patient's allergies indicates no known allergies.  Home Medications   Prior to Admission medications   Medication Sig Start Date End Date Taking? Authorizing Provider  acetaminophen (TYLENOL) 500 MG tablet Take 1,000 mg by mouth every 6 (six) hours as needed for moderate pain.    Historical Provider, MD  albuterol (PROVENTIL HFA;VENTOLIN HFA) 108 (90 BASE) MCG/ACT inhaler Inhale 2 puffs into the lungs every 4 (four) hours as needed for wheezing. 07/12/12   Elson Areas, PA-C  amoxicillin-clavulanate (AUGMENTIN) 875-125 MG per tablet Take 1 tablet by mouth 2 (two) times daily. 12/21/13   Ozella Rocks, MD  Aspirin Effervescent (ALKA-SELTZER PO) Take 2 tablets by mouth once.    Historical Provider, MD  bacitracin ointment Apply 1 application topically 2 (two) times daily. 09/25/14   Atarah Cadogan, PA-C  cephALEXin (KEFLEX) 500 MG capsule Take 1 capsule (500 mg total) by mouth 4 (four) times daily. 09/25/14   Tzvi Economou, PA-C  fluticasone (FLONASE) 50 MCG/ACT nasal spray Place 2 sprays into both nostrils at bedtime. 12/21/13   Ozella Rocks, MD  HYDROcodone-homatropine [facility]) 5-1.5 MG/5ML syrup Take 5 mLs by mouth every 6 (six) hours as needed. 04/06/13   Kaitlyn Szekalski, PA-C  ibuprofen (ADVIL,MOTRIN) 800 MG tablet Take 1 tablet (800 mg total) by mouth 3 (three) times daily. As needed for pain 09/25/14   [facility] Deserea Bordley, PA-C  ipratropium (ATROVENT) 0.06 % nasal spray Place 2  sprays into both nostrils 4 (four) times daily. 12/21/13   Ozella Rocks, MD   Triage Vitals: BP 131/74 mmHg  Pulse 81  Temp(Src) 98.6 F (37 C) (Oral)  Resp 16  SpO2 100%  Physical Exam  Constitutional: He appears well-developed and well-nourished. No distress.  HENT:  Head: Normocephalic and atraumatic.  Eyes: Conjunctivae are normal.  Neck: Normal range of motion.  Cardiovascular: Normal rate,  regular rhythm and intact distal pulses.   Capillary refill < 3 sec  Pulmonary/Chest: Effort normal and breath sounds normal.  Musculoskeletal: He exhibits tenderness. He exhibits no edema.  Left Middle Finger: Decreased ROM in DIP. Full ROM and PIP and MCP Swelling noted to entire distal finger including DIP  Neurological: He is alert. Coordination normal.  Sensation intact Strength 4/5 in DIP and 5/5 PIP and MCP  Skin: Skin is warm and dry. He is not diaphoretic.  No tenting of the skin. Superficial abrasion over DIP of right middle finger. Minimal swelling and erythema around abrasion site. Pad of finger is soft  Psychiatric: He has a normal mood and affect.  Nursing note and vitals reviewed.   ED Course  Procedures (including critical care time)  DIAGNOSTIC STUDIES: Oxygen Saturation is 100% on RA, normal by my interpretation.    COORDINATION OF CARE: 3:00 PM- Will order x-ray of right middle finger. Along with Ibuprofen for pain. Pt advised of plan for treatment and pt agrees.    Labs Review Labs Reviewed - No data to display  Imaging Review Dg Finger Middle Right  09/25/2014   CLINICAL DATA:  The patient crushed her right long finger in the door of the dishwasher 4 days ago. Continued pain. Initial encounter.  EXAM: RIGHT MIDDLE FINGER 2+V  COMPARISON:  None.  FINDINGS: There is no evidence of fracture or dislocation. There is no evidence of arthropathy or other focal bone abnormality. Soft tissues are unremarkable.  IMPRESSION: Negative exam.   Electronically Signed   By: Drusilla Kanner M.D.   On: 09/25/2014 15:21   I have personally reviewed and evaluated these images and lab results as part of my medical decision-making.   EKG Interpretation None      MDM   Final diagnoses:  Abrasion of right middle finger, initial encounter  Contusion of right middle finger, initial encounter   Stephen Warner presents with right middle finger pain persistent several days after  slamming it in the door of his dishwasher.  Pt with small abrasion to the dorsum of the DIP with mild surrounding erythema.  Swelling noted to the distal portion of the finger with tenderness but no clinical evidence of felon or paronychia.  Pad of the finger is soft. Pt is neurovascularly intact.    3:48 PM No evidence of fracture.  Swelling appears to be reactive.  Mild erythema is concerning for early infection.  Will begin on Keflex and have pt use bacitracin.  Advised pt to stop using peroxide on the wound.  Strict return precautions discussed including warning signs of felon and paronychia.    BP 116/67 mmHg  Pulse 76  Temp(Src) 97.9 F (36.6 C) (Oral)  Resp 12  SpO2 100%  I personally performed the services described in this documentation, which was scribed in my presence. The recorded information has been reviewed and is accurate.   Dierdre Forth, PA-C 09/25/14 1549  Melene Plan, DO 09/25/14 1751

## 2014-09-25 NOTE — ED Notes (Signed)
Patient transported to X-ray 

## 2014-09-25 NOTE — Discharge Instructions (Signed)
1. Medications: Ibuprofen, keflex, bacitracin, usual home medications 2. Treatment: rest, drink plenty of fluids, keep wound clean with warm soap and water; do not use peroxide or alcohol on the wound 3. Follow Up: Please followup with your primary doctor in 2-3 days for discussion of your diagnoses and further evaluation after today's visit; if you do not have a primary care doctor use the resource guide provided to find one; Please return to the ER for worsening symptoms including increasing swelling, increasing redness, evidence of pus or worsening pain    Contusion A contusion is a deep bruise. Contusions are the result of an injury that caused bleeding under the skin. The contusion may turn blue, purple, or yellow. Minor injuries will give you a painless contusion, but more severe contusions may stay painful and swollen for a few weeks.  CAUSES  A contusion is usually caused by a blow, trauma, or direct force to an area of the body. SYMPTOMS   Swelling and redness of the injured area.  Bruising of the injured area.  Tenderness and soreness of the injured area.  Pain. DIAGNOSIS  The diagnosis can be made by taking a history and physical exam. An X-ray, CT scan, or MRI may be needed to determine if there were any associated injuries, such as fractures. TREATMENT  Specific treatment will depend on what area of the body was injured. In general, the best treatment for a contusion is resting, icing, elevating, and applying cold compresses to the injured area. Over-the-counter medicines may also be recommended for pain control. Ask your caregiver what the best treatment is for your contusion. HOME CARE INSTRUCTIONS   Put ice on the injured area.  Put ice in a plastic bag.  Place a towel between your skin and the bag.  Leave the ice on for 15-20 minutes, 3-4 times a day, or as directed by your health care provider.  Only take over-the-counter or prescription medicines for pain,  discomfort, or fever as directed by your caregiver. Your caregiver may recommend avoiding anti-inflammatory medicines (aspirin, ibuprofen, and naproxen) for 48 hours because these medicines may increase bruising.  Rest the injured area.  If possible, elevate the injured area to reduce swelling. SEEK IMMEDIATE MEDICAL CARE IF:   You have increased bruising or swelling.  You have pain that is getting worse.  Your swelling or pain is not relieved with medicines. MAKE SURE YOU:   Understand these instructions.  Will watch your condition.  Will get help right away if you are not doing well or get worse. Document Released: 10/29/2004 Document Revised: 01/24/2013 Document Reviewed: 11/24/2010 [facility] Patient Information 2015 [place], [place]. This information is not intended to replace advice given to you by your health care provider. Make sure you discuss any questions you have with your health care provider.    Emergency Department Resource Guide 1) Find a Doctor and Pay Out of Pocket Although you won't have to find out who is covered by your insurance plan, it is a good idea to ask around and get recommendations. You will then need to call the office and see if the doctor you have chosen will accept you as a new patient and what types of options they offer for patients who are self-pay. Some doctors offer discounts or will set up payment plans for their patients who do not have insurance, but you will need to ask so you aren't surprised when you get to your appointment.  2) Contact Your Local Health Department Not  all health departments have doctors that can see patients for sick visits, but many do, so it is worth a call to see if yours does. If you don't know where your local health department is, you can check in your phone book. The CDC also has a tool to help you locate your state's health department, and many state websites also have listings of all of their local health  departments.  3) Find a Walk-in Clinic If your illness is not likely to be very severe or complicated, you may want to try a walk in clinic. These are popping up all over the country in pharmacies, drugstores, and shopping centers. They're usually staffed by nurse practitioners or physician assistants that have been trained to treat common illnesses and complaints. They're usually fairly quick and inexpensive. However, if you have serious medical issues or chronic medical problems, these are probably not your best option.  No Primary Care Doctor: - Call Health Connect at  [phone] - they can help you locate a primary care doctor that  accepts your insurance, provides certain services, etc. - Physician Referral Service- [phone]  Chronic Pain Problems: Organization         Address  Phone   Notes  [facility]  [phone] Patients need to be referred by their primary care doctor.   Medication Assistance: Organization         Address  Phone   Notes  [facility] [address] [place], [place] [address] [phone] --Must be a resident of [place] [facility] [place] -- Must have NO insurance coverage whatsoever (no Medicaid/ Medicare, etc.) -- The pt. MUST have a primary care doctor that directs their care regularly and follows them in the community   [facility]ist  [phone]   [employer]  [phone]    Agencies that provide inexpensive medical care: Organization         Address  Phone   Notes  [facility]  [phone]   [facility]    [phone]) [phone]   [facility] [address] [place], [place] [address] [phone]   [facility] [address], [place] [phone]) [phone]   [facility]    [phone](336) [phone]   [facility]    [phone]   [facility]  [address], [place] Phone:  [phone](336)  [phone], Fax:  [phone]) [phone] Hours of Operation:  9 am - 6 pm, M-F.  Also accepts Medicaid/Medicare and self-pay.  [facility]  [address], [place] Phone: [phone](336) [phone], Fax: [phone]) [phone]. Hours of Operation:  8:30 am - 5:30 pm, M-F.  Also accepts Medicaid and self-pay.  [facility] [place] [address], [place] Phone: [phone]) [phone]   [facility] [address], [place], [place] [phone], Ext. [phone] Mondays & Thursdays: 7-9 AM.  First 15 patients are seen on a first come, first serve basis.    Medicaid-accepting [facility] Providers:  Organization         Address  Phone   Notes  [facility] [address], [place] [phone] Also accepts self-pay patients.  [facility] [address], [place]  [phone]   [facility] [address], [place] [phone]   [facility] [address], [place] [phone]   Renaye Rakers [address], [place]   [phone] Only accepts [place] Access [place] patients after they have their name applied to their card.   Self-Pay (no insurance) in [facility]:  Organization         Address  Phone   Notes  Sickle Cell Patients, [facility] [address] [place], [place] [phone]   [facility] [address] [place], [place] [phone]   [facility] [place]  [address], [place] [phone]   [facility]/Dr. Osei-Bonsu  [address], [place] or [address], [place] [phone] Phone number for both [place] and [place] locations is the same.  [facility] and [facility] [address], [place] [phone]   [facility] [address], [place] or [address] [phone]   [facility] [address] [place] [phone], phone; [phone], fax Sees patients 1st and 3rd Saturday of every month.  Must not qualify for public or private insurance (i.e. Medicaid, Medicare, [place] Health Choice, Veterans' Benefits)  Household income should be no more than 200% of the poverty level The clinic cannot treat you if you are pregnant or think you are pregnant  Sexually transmitted diseases are not treated at the clinic.    Dental Care: Organization         Address  Phone  Notes  [facility] [address] [place], [place] [phone] Accepts children up to age 13 who are enrolled in [place] or [place] Health Choice; pregnant women with a Medicaid card; and children who have applied for Medicaid or [place] Health Choice, but were declined, whose parents can pay a reduced fee at time of service.  [facility] Department of [facility]  [address], [place] [phone] Accepts children up to age 72 who are enrolled in [place] or [place] Health Choice; pregnant women with a Medicaid card; and children who have applied for Medicaid or [place] Health Choice, but were declined, whose parents can pay a reduced fee at time of service.  [facility]  [address] [place], [place] [phone] Patients are seen by appointment only. Walk-ins are not accepted. [facility] will see patients 38 years of age and older. Monday - Tuesday (8am-5pm) Most Wednesdays (8:30-5pm) $30 per visit, cash only  [facility] Adult Dental Access PROGRAM  [address], [facility] [address] [phone] Patients are seen by appointment only. Walk-ins are not accepted. [facility] will see patients 44 years of age and older. One Wednesday Evening (Monthly: Volunteer Based).  $30 per visit, cash only  [employer]  [phone] for adults;  Children under age 80, call Graduate Pediatric Dentistry at [phone]. Children aged 52-14, please call [phone] to request a pediatric application.  Dental services are provided in all areas of dental care including fillings, crowns and bridges, complete and partial dentures, implants, gum treatment, root canals, and extractions. Preventive care is also provided. Treatment is provided to both adults and children. Patients are selected via a lottery and there is often a waiting list.   [facility] [address] [place]  [phone] [email or web address]  [facility] [address] [place], [place] [phone], Ext. [phone] Second and Fourth Thursday of each month, opens at 6:30 AM; Clinic ends at 9 AM.  Patients are seen on a first-come first-served basis, and a limited number are seen during each clinic.   [facility]  [address] [place], [place] [phone]   Eligibility Requirements You must have lived in [place], [place], or [place] counties for at least the last three months.   You cannot be eligible for state or federal sponsored [place], including [employer], [place], or [employer].   You generally cannot be eligible for healthcare insurance through your employer.    How to apply: Eligibility screenings are held every Tuesday and Wednesday afternoon from 1:00 pm until 4:00 pm. You do not need an appointment for the interview!  [facility] [address], [place], [place] [phone]   [facility] Health [facility]  [phone]   [facility] Health Department  [phone]   [facility] Health Department  [phone]    Behavioral Health Resources in the Community: Intensive Outpatient Programs Organization         Address  Phone  Notes  [facility] [address], [place], [place] [phone]   [facility] Outpatient [address], [place], [place] [phone]   [facility] [address], [place], [place]  [phone]   [facility] Mental Health [address]  [place], [place] [phone] or [phone]   Substance Abuse Resources Organization         Address  Phone  Notes  [facility]  [phone]   [facility]  [phone]   [facility] [place]  [phone]   Floydene Flock  [phone]   Residential & Outpatient Substance Abuse Program  [phone]   Psychological Services Organization         Address  Phone  Notes  [facility]  [phone]   [facility] Services  [phone]   [facility] [address] [place] [phone] or [phone]    Mobile Crisis Teams Organization         Address  Phone  Notes  [facility], Mobile Crisis Care Unit  [phone]   [facility]  [address] [place], [place] [phone]   Doristine Locks [address] [place] [phone]    Self-Help/Support Groups Organization         Address  Phone             Notes  [facility] [place] - variety of support groups  336- I7437963 Call for more information  Narcotics Anonymous (NA), [facility] [address], [employer]   2 meetings at this location   Statistician         Address  Phone  Notes  [facility] [address],    [place]  [phone]   [facility]  [address], [place] [address], [place], [place] [phone]   [facility] [address] [place], [place] [phone] Admissions: 8am-3pm M-F  [facility] [address]    [place], [place] [phone]   [facility] [address] [place], [place] [phone]   [facility] [address]  [place], [place] [phone]   [facility] - Intensive  Outpatient [phone], [place], [place] [phone]   °[facility] ([facility] [address]  °[place], [place] [phone] or [phone]   °[facility] ([facility]) [address] [place], [place] [phone] Accepts Medicaid  °[facility] [address]  ° [place] 1-800-659-3381 Substance Abuse/Addiction Treatment  ° °[place] Behavioral Health Resources °Organization         Address  Phone  Notes  °[facility]  ([phone]) [phone]   °Julie Brannon, PhD [address] [place], [place]   ([phone]) [phone] or (336) 951-0000   °[facility]   [address] °[place], [place] (336) [phone]   °[facility] [address], [place], [place] ([phone]) [phone] Insurance/Medicaid/sponsorship through [facility]  °[facility] [address]                                    [place], [place] [phone]) [phone] Therapy/tele-psych/case  °[facility] [address]  ° [place], [place] ([phone]) [phone]    °Dr. Arfeen  ([phone]) [phone]   °[facility] [place]  [employer] [place] [facility] 1) [address], [place] °2) [address], [place] °3)  [address], [place] ([phone]) [phone] °(336) [phone] ° °(336) [phone]   °[place] Child Abuse Hotline (336) 342-1394 or (336) [phone] (After Hours)    ° ° ° °

## 2014-09-25 NOTE — ED Notes (Signed)
Pt shut right hand in dishwasher at home a few days ago; says pain worse; full ROM and minimal swelling to fingers.

## 2014-09-27 ENCOUNTER — Encounter (HOSPITAL_COMMUNITY): Payer: Self-pay | Admitting: Emergency Medicine

## 2014-09-27 ENCOUNTER — Emergency Department (HOSPITAL_COMMUNITY)
Admission: EM | Admit: 2014-09-27 | Discharge: 2014-09-27 | Disposition: A | Discharge status: Home / Self Care | Payer: Self-pay | Attending: Emergency Medicine | Admitting: Emergency Medicine

## 2014-09-27 DIAGNOSIS — W228XXA Striking against or struck by other objects, initial encounter: Secondary | ICD-10-CM | POA: Insufficient documentation

## 2014-09-27 DIAGNOSIS — Y9289 Other specified places as the place of occurrence of the external cause: Secondary | ICD-10-CM | POA: Insufficient documentation

## 2014-09-27 DIAGNOSIS — Z7951 Long term (current) use of inhaled steroids: Secondary | ICD-10-CM | POA: Insufficient documentation

## 2014-09-27 DIAGNOSIS — L03011 Cellulitis of right finger: Secondary | ICD-10-CM | POA: Insufficient documentation

## 2014-09-27 DIAGNOSIS — Z8709 Personal history of other diseases of the respiratory system: Secondary | ICD-10-CM | POA: Insufficient documentation

## 2014-09-27 DIAGNOSIS — Z87891 Personal history of nicotine dependence: Secondary | ICD-10-CM | POA: Insufficient documentation

## 2014-09-27 DIAGNOSIS — Y9389 Activity, other specified: Secondary | ICD-10-CM | POA: Insufficient documentation

## 2014-09-27 DIAGNOSIS — Y998 Other external cause status: Secondary | ICD-10-CM | POA: Insufficient documentation

## 2014-09-27 DIAGNOSIS — Z79899 Other long term (current) drug therapy: Secondary | ICD-10-CM | POA: Insufficient documentation

## 2014-09-27 NOTE — ED Provider Notes (Signed)
CSN: [id number]     Arrival date & time 09/27/14  0025 History   First MD Initiated Contact with Patient 09/27/14 [phone]     Chief Complaint  Patient presents with  . Finger Injury     (Consider location/radiation/quality/duration/timing/severity/associated sxs/prior Treatment) HPI Comments: C/o pain and swelling to R 3rd digit. States he was seen in ED yesterday and told to come back if he got worse. Reports increased swelling to R 3rd digit after shutting it in dishwasher over the weekend.  Patient is a 29 y.o. male presenting with hand injury. The history is provided by the patient.  Hand Injury Location:  Finger Finger location:  R middle finger Pain details:    Quality:  Throbbing   Radiates to:  Does not radiate   Onset quality:  Gradual   Timing:  Constant   Progression:  Worsening Chronicity:  New Tetanus status:  Up to date Prior injury to area:  No Relieved by:  Nothing Worsened by:  Movement Ineffective treatments:  None tried Associated symptoms: swelling   Associated symptoms: no fever and no tingling     Past Medical History  Diagnosis Date  . Bronchitis    History reviewed. No pertinent past surgical history. No family history on file. Social History  Substance Use Topics  . Smoking status: Former Smoker    Types: Cigars    Quit date: 02/02/2013  . Smokeless tobacco: Never Used  . Alcohol Use: No    Review of Systems  Constitutional: Negative for fever.  All other systems reviewed and are negative.     Allergies  Review of patient's allergies indicates no known allergies.  Home Medications   Prior to Admission medications   Medication Sig Start Date End Date Taking? Authorizing Provider  acetaminophen (TYLENOL) 500 MG tablet Take 1,000 mg by mouth every 6 (six) hours as needed for moderate pain.    Historical Provider, MD  albuterol (PROVENTIL HFA;VENTOLIN HFA) 108 (90 BASE) MCG/ACT inhaler Inhale 2 puffs into the lungs every 4 (four) hours  as needed for wheezing. 07/12/12   Elson Areas, PA-C  amoxicillin-clavulanate (AUGMENTIN) 875-125 MG per tablet Take 1 tablet by mouth 2 (two) times daily. 12/21/13   Ozella Rocks, MD  Aspirin Effervescent (ALKA-SELTZER PO) Take 2 tablets by mouth once.    Historical Provider, MD  bacitracin ointment Apply 1 application topically 2 (two) times daily. 09/25/14   Hannah Muthersbaugh, PA-C  cephALEXin (KEFLEX) 500 MG capsule Take 1 capsule (500 mg total) by mouth 4 (four) times daily. 09/25/14   Hannah Muthersbaugh, PA-C  fluticasone (FLONASE) 50 MCG/ACT nasal spray Place 2 sprays into both nostrils at bedtime. 12/21/13   Ozella Rocks, MD  HYDROcodone-homatropine [facility]) 5-1.5 MG/5ML syrup Take 5 mLs by mouth every 6 (six) hours as needed. 04/06/13   Kaitlyn Szekalski, PA-C  ibuprofen (ADVIL,MOTRIN) 800 MG tablet Take 1 tablet (800 mg total) by mouth 3 (three) times daily. As needed for pain 09/25/14   [facility] Muthersbaugh, PA-C  ipratropium (ATROVENT) 0.06 % nasal spray Place 2 sprays into both nostrils 4 (four) times daily. 12/21/13   Ozella Rocks, MD   BP 132/78 mmHg  Pulse 75  Temp(Src) 98.5 F (36.9 C) (Oral)  Resp 18  SpO2 96% Physical Exam  Constitutional: He is oriented to person, place, and time. He appears well-developed and well-nourished. No distress.  HENT:  Head: Normocephalic and atraumatic.  Right Ear: External ear normal.  Left Ear: External ear normal.  Nose: Nose normal.  Mouth/Throat: Oropharynx is clear and moist.  Eyes: Conjunctivae are normal.  Neck: Normal range of motion. Neck supple.  No nuchal rigidity.   Cardiovascular: Normal rate, regular rhythm, normal heart sounds and intact distal pulses.   Cap refill < 3 sec  Pulmonary/Chest: Effort normal.  Abdominal: Soft.  Musculoskeletal: Normal range of motion.       Right hand: He exhibits tenderness (3rd digit) and swelling (3rd digit along lateral nail fold fluctuant). He exhibits normal range of motion,  normal capillary refill and no laceration. Normal sensation noted. Normal strength noted.  Neurological: He is alert and oriented to person, place, and time.  Skin: Skin is warm and dry. He is not diaphoretic.  Psychiatric: He has a normal mood and affect.  Nursing note and vitals reviewed.   ED Course  Procedures (including critical care time) Medications - No data to display  Labs Review Labs Reviewed - No data to display  Imaging Review Dg Finger Middle Right  09/25/2014   CLINICAL DATA:  The patient crushed her right long finger in the door of the dishwasher 4 days ago. Continued pain. Initial encounter.  EXAM: RIGHT MIDDLE FINGER 2+V  COMPARISON:  None.  FINDINGS: There is no evidence of fracture or dislocation. There is no evidence of arthropathy or other focal bone abnormality. Soft tissues are unremarkable.  IMPRESSION: Negative exam.   Electronically Signed   By: Drusilla Kanner M.D.   On: 09/25/2014 15:21   I have personally reviewed and evaluated these images and lab results as part of my medical decision-making.   EKG Interpretation None      INCISION AND DRAINAGE Performed by: Francee Piccolo L Consent: Verbal consent obtained. Risks and benefits: risks, benefits and alternatives were discussed Type: abscess  Body area: right middle finger  Anesthesia: NA  Incision was made with a scalpel.  Local anesthetic: NA  Anesthetic total: 0 ml  Complexity: complex Blunt dissection to break up loculations  Drainage: purulent  Drainage amount: copious  Patient tolerance: Patient tolerated the procedure well with no immediate complications.    MDM   Final diagnoses:  Paronychia of finger of right hand   Filed Vitals:   09/27/14 0300  BP: 132/78  Pulse: 75  Temp:   Resp:    Afebrile, NAD, non-toxic appearing, AAOx4.    Patient with skin paronychia amenable to incision and drainage. Neurovascularly intact. Normal sensation. No evidence of  compartment syndrome.  Advised  wound recheck in 2 days. Encouraged home warm soaks and flushing.  Advised to finished course of Keflex prescribed previous day in ER. Return precautions discussed. Patient is agreeable to plan. Patient is stable at time of discharge   Francee Piccolo, PA-C 09/27/14 1610  Marisa Severin, MD 09/27/14 [phone]

## 2014-09-27 NOTE — ED Notes (Signed)
Pt given towels to prop up hand.

## 2014-09-27 NOTE — ED Notes (Signed)
C/o pain and swelling to R 3rd digit.  States he was seen in ED yesterday and told to come back if he got worse.  Reports increased swelling to R 3rd digit after shutting it in dishwasher over the weekend.

## 2014-09-27 NOTE — ED Notes (Signed)
Pts middle finger on right hand is swollen.Cap refill is less than 3 seconds and is warm to the touch. The pt states that it feels "numb like" around his finger nail.

## 2014-09-27 NOTE — Discharge Instructions (Signed)
Please follow up with your primary care physician in 1-2 days. If you do not have one please call the [facility] and wellness Center number listed above. Please continue the keflex. Please read all discharge instructions and return precautions.    Paronychia Paronychia is an inflammatory reaction involving the folds of the skin surrounding the fingernail. This is commonly caused by an infection in the skin around a nail. The most common cause of paronychia is frequent wetting of the hands (as seen with bartenders, food servers, nurses or others who wet their hands). This makes the skin around the fingernail susceptible to infection by bacteria (germs) or fungus. Other predisposing factors are:  Aggressive manicuring.  Nail biting.  Thumb sucking. The most common cause is a staphylococcal (a type of germ) infection, or a fungal (Candida) infection. When caused by a germ, it usually comes on suddenly with redness, swelling, pus and is often painful. It may get under the nail and form an abscess (collection of pus), or form an abscess around the nail. If the nail itself is infected with a fungus, the treatment is usually prolonged and may require oral medicine for up to one year. Your caregiver will determine the length of time treatment is required. The paronychia caused by bacteria (germs) may largely be avoided by not pulling on hangnails or picking at cuticles. When the infection occurs at the tips of the finger it is called felon. When the cause of paronychia is from the herpes simplex virus (HSV) it is called herpetic whitlow. TREATMENT  When an abscess is present treatment is often incision and drainage. This means that the abscess must be cut open so the pus can get out. When this is done, the following home care instructions should be followed. HOME CARE INSTRUCTIONS   It is important to keep the affected fingers very dry. Rubber or plastic gloves over cotton gloves should be used whenever the  hand must be placed in water.  Keep wound clean, dry and dressed as suggested by your caregiver between warm soaks or warm compresses.  Soak in warm water for fifteen to twenty minutes three to four times per day for bacterial infections. Fungal infections are very difficult to treat, so often require treatment for long periods of time.  For bacterial (germ) infections take antibiotics (medicine which kill germs) as directed and finish the prescription, even if the problem appears to be solved before the medicine is gone.  Only take over-the-counter or prescription medicines for pain, discomfort, or fever as directed by your caregiver. SEEK IMMEDIATE MEDICAL CARE IF:  You have redness, swelling, or increasing pain in the wound.  You notice pus coming from the wound.  You have a fever.  You notice a bad smell coming from the wound or dressing. Document Released: 07/15/2000 Document Revised: 04/13/2011 Document Reviewed: 03/16/2008 [facility] Patient Information 2015 [place], [place]. This information is not intended to replace advice given to you by your health care provider. Make sure you discuss any questions you have with your health care provider.

## 2014-09-27 NOTE — ED Notes (Signed)
Pts hand is soaking in warm water.

## 2014-10-04 ENCOUNTER — Encounter: Payer: Self-pay | Admitting: Family Medicine

## 2014-10-04 ENCOUNTER — Ambulatory Visit (INDEPENDENT_AMBULATORY_CARE_PROVIDER_SITE_OTHER): Payer: Self-pay | Admitting: Family Medicine

## 2014-10-04 VITALS — BP 126/85 | HR 71 | Temp 98.3°F | Resp 16 | Ht 70.0 in | Wt 180.0 lb

## 2014-10-04 DIAGNOSIS — L03011 Cellulitis of right finger: Secondary | ICD-10-CM

## 2014-10-04 DIAGNOSIS — Z7689 Persons encountering health services in other specified circumstances: Secondary | ICD-10-CM

## 2014-10-04 DIAGNOSIS — Z7189 Other specified counseling: Secondary | ICD-10-CM

## 2014-10-04 NOTE — Patient Instructions (Signed)
You seem in good health at this time. Try to eat a diet high in fresh fruits and vegetables, low in fats, cholesterol, sweets. Try to prevent weight loss as you get older Try to exercise regularly You may return to work tomorrow but wrap your finger to protect it from injury. Follow-up in one year unless your need to be seen before then. When you can afford, I would recommend a tetanus booster and screening for HIV.

## 2014-10-04 NOTE — Progress Notes (Signed)
Patient ID: Stephen Warner, male   DOB: 1985-08-19, 29 y.o.   MRN: [id number]   Stephen Warner, is a 29 y.o. male  [id number]  [id number]  DOB - Sep 16, 1985  CC:  Chief Complaint  Patient presents with  . Establish Care    hospital follow up on wound on right hand middle finger        HPI: Stephen Warner is a 29 y.o. male here to establish care. He denies chronic health problems. He was seen in the ED twice recently around 8:15 4 paronychia off his right third finger. He has been treated with Keflex 500 and reports significant improvement. He is just here today to establish care. No Known Allergies Past Medical History  Diagnosis Date  . Bronchitis    Current Outpatient Prescriptions on File Prior to Visit  Medication Sig Dispense Refill  . acetaminophen (TYLENOL) 500 MG tablet Take 1,000 mg by mouth every 6 (six) hours as needed for moderate pain.    Marland Kitchen albuterol (PROVENTIL HFA;VENTOLIN HFA) 108 (90 BASE) MCG/ACT inhaler Inhale 2 puffs into the lungs every 4 (four) hours as needed for wheezing. 1 Inhaler 0  . Aspirin Effervescent (ALKA-SELTZER PO) Take 2 tablets by mouth once.    . bacitracin ointment Apply 1 application topically 2 (two) times daily. 120 g 0  . cephALEXin (KEFLEX) 500 MG capsule Take 1 capsule (500 mg total) by mouth 4 (four) times daily. 40 capsule 0  . ibuprofen (ADVIL,MOTRIN) 800 MG tablet Take 1 tablet (800 mg total) by mouth 3 (three) times daily. As needed for pain 21 tablet 0  . amoxicillin-clavulanate (AUGMENTIN) 875-125 MG per tablet Take 1 tablet by mouth 2 (two) times daily. (Patient not taking: Reported on 10/04/2014) 20 tablet 0  . fluticasone (FLONASE) 50 MCG/ACT nasal spray Place 2 sprays into both nostrils at bedtime. (Patient not taking: Reported on 10/04/2014) 16 g 0  . HYDROcodone-homatropine (HYCODAN) 5-1.5 MG/5ML syrup Take 5 mLs by mouth every 6 (six) hours as needed. (Patient not taking: Reported on 10/04/2014) 120 mL 0  . ipratropium (ATROVENT) 0.06 %  nasal spray Place 2 sprays into both nostrils 4 (four) times daily. (Patient not taking: Reported on 10/04/2014) 15 mL 12   No current facility-administered medications on file prior to visit.   Family History  Problem Relation Age of Onset  . Diabetes Paternal Uncle   . Diabetes Paternal Grandfather    Social History   Social History  . Marital Status: Single    Spouse Name: N/A  . Number of Children: N/A  . Years of Education: N/A   Occupational History  . Not on file.   Social History Main Topics  . Smoking status: Former Smoker    Types: Cigars    Quit date: 02/02/2013  . Smokeless tobacco: Never Used  . Alcohol Use: No  . Drug Use: No     Comment: none since 02/02/2013  . Sexual Activity: Not on file   Other Topics Concern  . Not on file   Social History Narrative    Review of Systems: Constitutional: Negative for fever, chills, appetite change, weight loss,  fatigue. HENT: Negative for ear pain, ear discharge.nose bleeds Eyes: Negative for pain, discharge, redness, itching and visual disturbance. Neck: Negative for pain, stiffness Respiratory: Negative for cough, shortness of breath,   Cardiovascular: Negative for chest pain, palpitations and leg swelling. Gastrointestinal: Negative for abdominal distention, abdominal pain, nausea, vomiting, diarrhea, constipations Genitourinary: Negative for dysuria, urgency, frequency, hematuria, flank pain,  Musculoskeletal: Negative for back pain, joint pain, joint  swelling, arthralgia and gait problem.Negative for weakness. Neurological: Negative for dizziness, tremors, seizures, syncope,   light-headedness, numbness and headaches.  Hematological: Negative for easy bruising or bleeding Psychiatric/Behavioral: Negative for depression, anxiety, decreased concentration, confusion Skin: Positive for infection around nail of right 3rd finger    Objective:   Filed Vitals:   10/04/14 1316  BP: 126/85  Pulse: 71  Temp: 98.3  F (36.8 C)  Resp: 16    Physical Exam: Constitutional: Patient appears well-developed and well-nourished. No distress. HENT: Normocephalic, atraumatic, External right and left ear normal. Oropharynx is clear and moist.  Eyes: Conjunctivae and EOM are normal. PERRLA, no scleral icterus. Neck: Normal ROM. Neck supple. No lymphadenopathy, No thyromegaly. CVS: RRR, S1/S2 +, no murmurs, no gallops, no rubs Pulmonary: Effort and breath sounds normal, no stridor, rhonchi, wheezes, rales.  Abdominal: Soft. Normoactive BS,, no distension, tenderness, rebound or guarding.  Musculoskeletal: Normal range of motion. No edema and no tenderness.  Neuro: Alert.Normal muscle tone coordination. Non-focal Skin: Skin is warm and dry. No rash noted. Not diaphoretic. No erythema. No pallor.There is a healing infection at the base of the nail of the right third with no evidence of active infection. Psychiatric: Normal mood and affect. Behavior, judgment, thought content normal.  Lab Results  Component Value Date   WBC 5.3 04/23/2014   HGB 13.4 04/23/2014   HCT 41.3 04/23/2014   MCV 91.8 04/23/2014   PLT 260 04/23/2014   Lab Results  Component Value Date   CREATININE 0.98 04/23/2014   BUN 16 04/23/2014   NA 140 04/23/2014   K 3.8 04/23/2014   CL 106 04/23/2014   CO2 26 04/23/2014    No results found for: HGBA1C Lipid Panel  No results found for: CHOL, TRIG, HDL, CHOLHDL, VLDL, LDLCALC     Assessment and plan:   Establish Care -I have review information, presented by the patient and from the ED record  Paronychia, resolving -Continute antibiotics until finished and continue warm soaks -He may return to work tomorrow with the finger wrapped and protected from further injury  -[employer] -He will return when he obtains an orange card or [facility] Card to address health maintenance needs.  Return if symptoms worsen or fail to improve.  The patient was given clear instructions  to go to ER or return to medical center if symptoms don't improve, worsen or new problems develop. The patient verbalized understanding.      Stephen Hoover, MSN, FNP-BC   10/05/2014, 10:29 AM

## 2014-10-05 DIAGNOSIS — L03019 Cellulitis of unspecified finger: Secondary | ICD-10-CM | POA: Insufficient documentation

## 2016-02-18 ENCOUNTER — Encounter (HOSPITAL_COMMUNITY): Payer: Self-pay | Admitting: *Deleted

## 2016-02-18 ENCOUNTER — Emergency Department (HOSPITAL_COMMUNITY): Payer: Self-pay

## 2016-02-18 ENCOUNTER — Emergency Department (HOSPITAL_COMMUNITY)
Admission: EM | Admit: 2016-02-18 | Discharge: 2016-02-18 | Disposition: A | Discharge status: Home / Self Care | Payer: Self-pay | Attending: Emergency Medicine | Admitting: Emergency Medicine

## 2016-02-18 DIAGNOSIS — B9789 Other viral agents as the cause of diseases classified elsewhere: Secondary | ICD-10-CM

## 2016-02-18 DIAGNOSIS — Z87891 Personal history of nicotine dependence: Secondary | ICD-10-CM | POA: Insufficient documentation

## 2016-02-18 DIAGNOSIS — Z7982 Long term (current) use of aspirin: Secondary | ICD-10-CM | POA: Insufficient documentation

## 2016-02-18 DIAGNOSIS — J069 Acute upper respiratory infection, unspecified: Secondary | ICD-10-CM | POA: Insufficient documentation

## 2016-02-18 MED ORDER — GUAIFENESIN-DM 100-10 MG/5ML PO SYRP: 5.0000 mL | ORAL_SOLUTION | ORAL | 0 refills | Status: DC | PRN

## 2016-02-18 MED ORDER — BENZONATATE 100 MG PO CAPS: 100.0000 mg | ORAL_CAPSULE | Freq: Three times a day (TID) | ORAL | 0 refills | Status: DC

## 2016-02-18 NOTE — ED Notes (Signed)
Patient transported to X-ray 
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
## 2016-02-18 NOTE — Discharge Instructions (Signed)
Take tylenol and motrin for pain and fever. Drink plenty of fluids. Rest. Take tessalon and robitussin for cough as prescribed as needed. Follow up with primary care doctor if not improving. Return if worsening symptoms.
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
## 2016-02-18 NOTE — ED Notes (Signed)
Declined W/C at D/C and was escorted to lobby by RN. 

## 2016-02-18 NOTE — ED Notes (Signed)
EDP at bedside  

## 2016-04-20 IMAGING — CR DG CHEST 2V
2 series · 2 of 2 positions shown · non-contrast
Comparison: 04/06/2013

CLINICAL DATA: Tachycardia and syncope for 1 day.

EXAM:
CHEST  2 VIEW

[w chest pa]
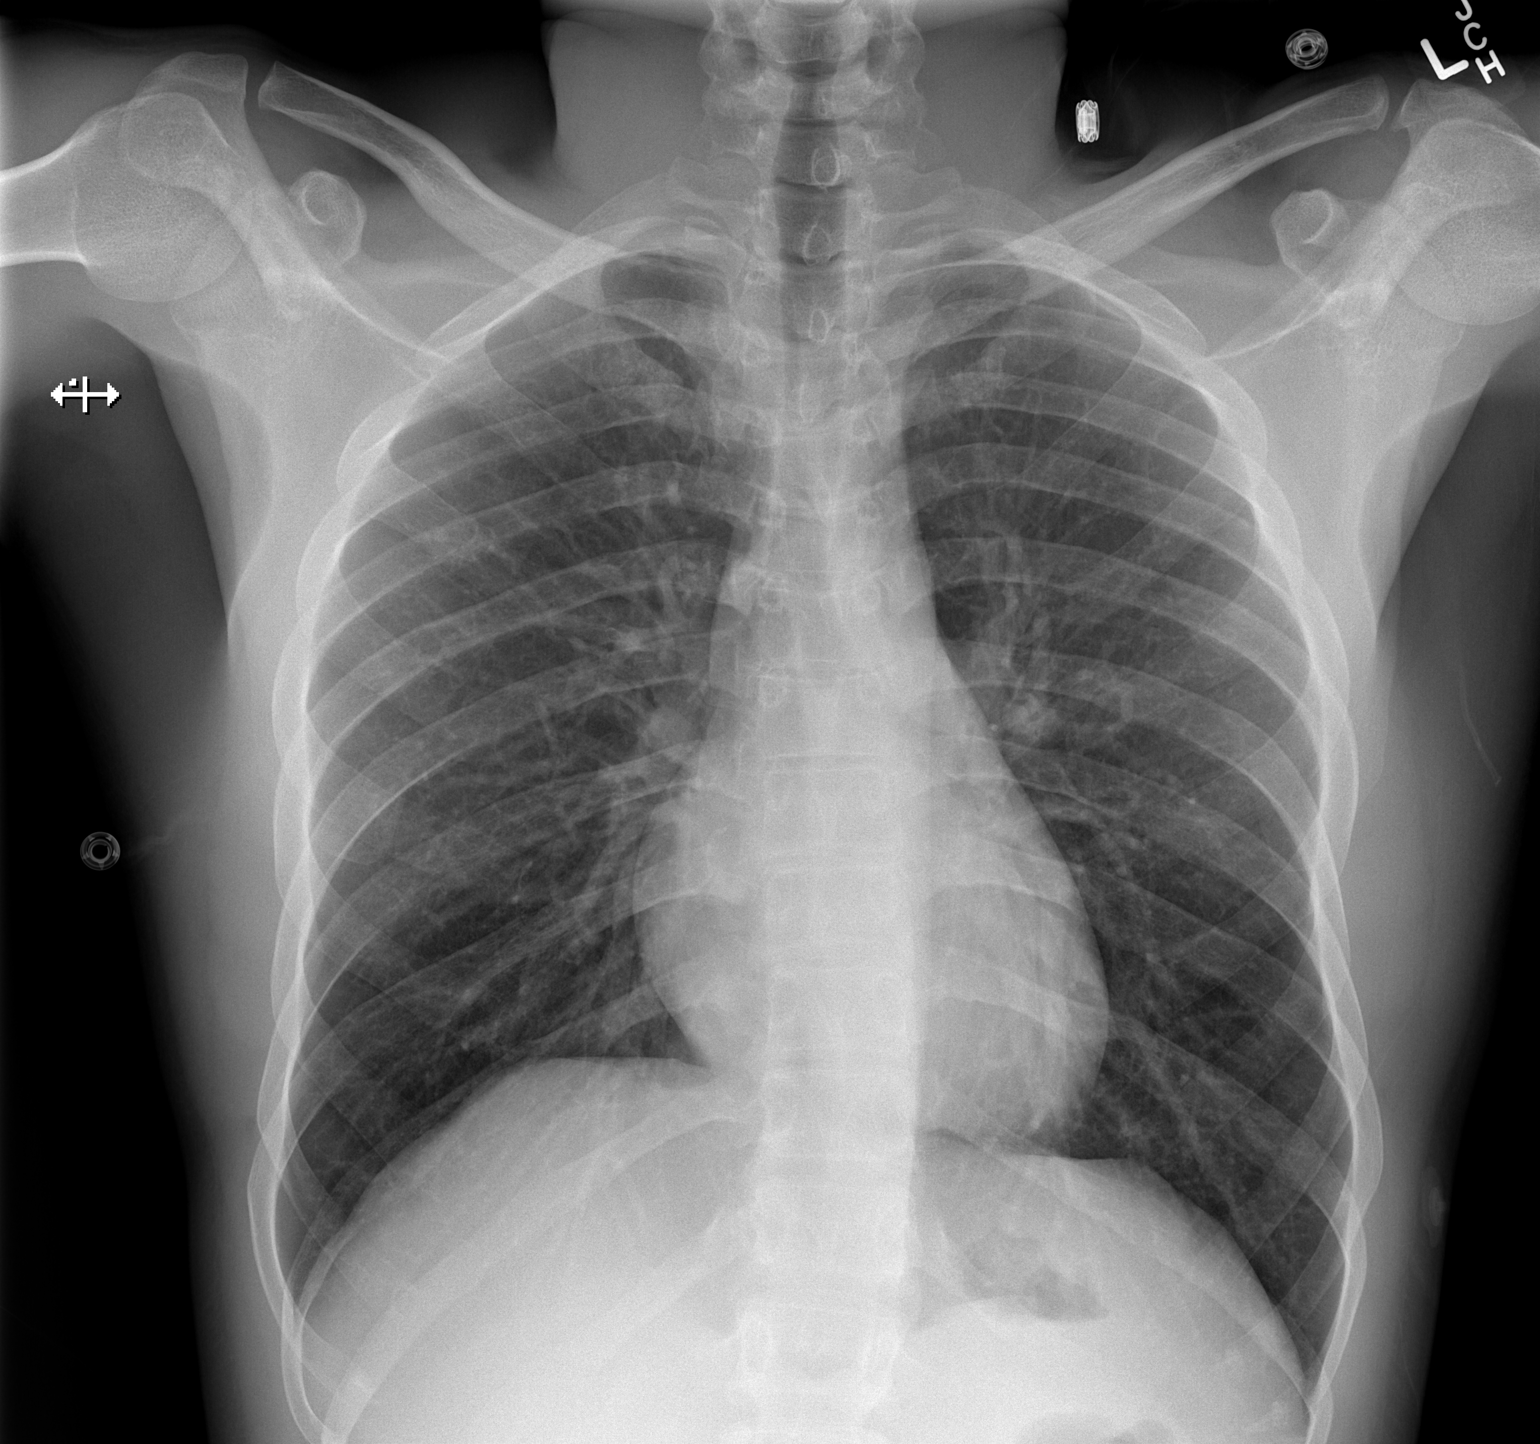

[w chest lat]
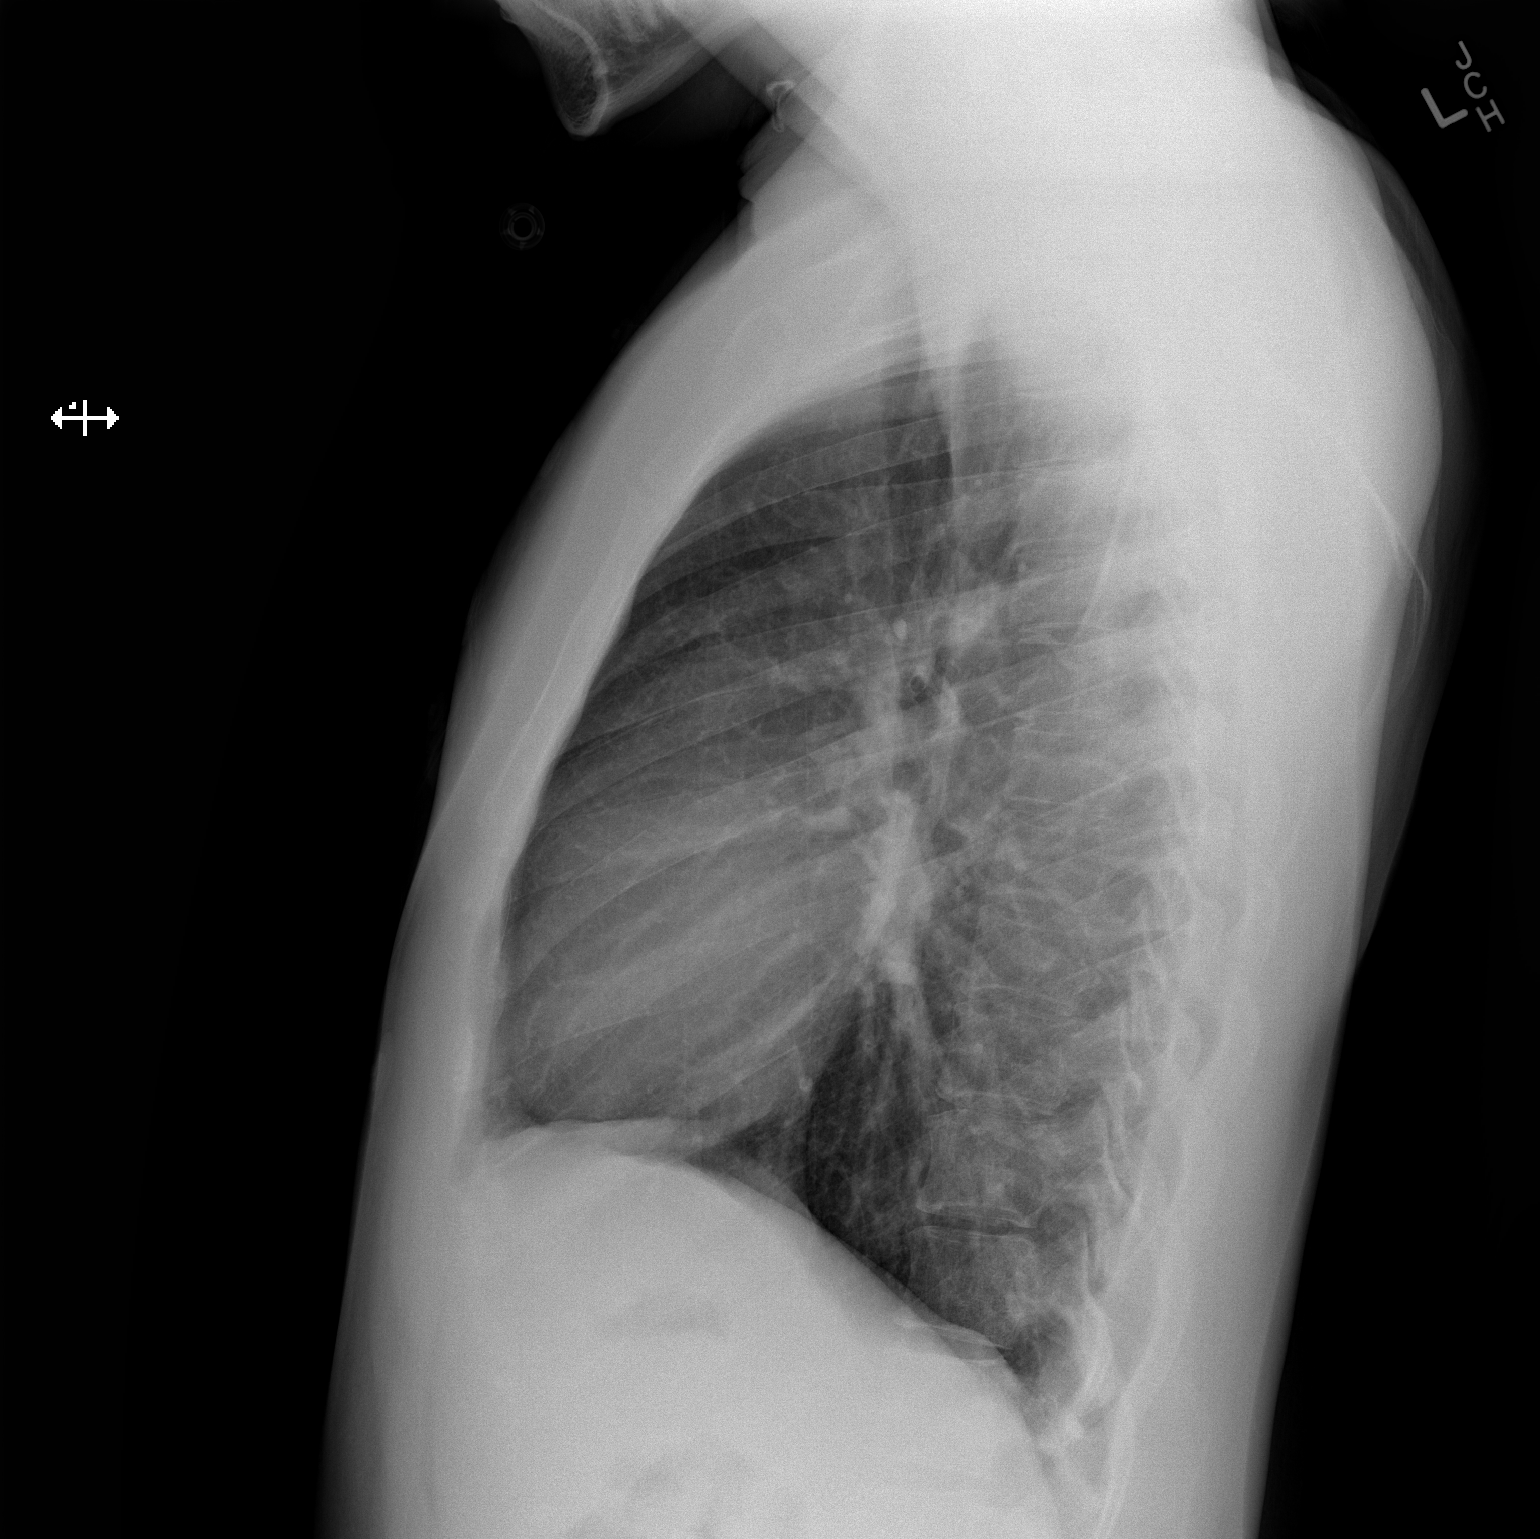

[2 of 2 positions shown; findings below may reference images not displayed]

FINDINGS: The heart size and mediastinal contours are within normal limits.
Both lungs are clear. The visualized skeletal structures are
unremarkable.
IMPRESSION: Normal chest x-ray.

## 2017-01-07 ENCOUNTER — Other Ambulatory Visit: Payer: Self-pay

## 2017-01-07 ENCOUNTER — Encounter (HOSPITAL_COMMUNITY): Payer: Self-pay

## 2017-01-07 ENCOUNTER — Emergency Department (HOSPITAL_COMMUNITY)
Admission: EM | Admit: 2017-01-07 | Discharge: 2017-01-07 | Disposition: A | Discharge status: Home / Self Care | Payer: Managed Care, Other (non HMO) | Attending: Emergency Medicine | Admitting: Emergency Medicine

## 2017-01-07 DIAGNOSIS — Z87891 Personal history of nicotine dependence: Secondary | ICD-10-CM | POA: Insufficient documentation

## 2017-01-07 DIAGNOSIS — Z79899 Other long term (current) drug therapy: Secondary | ICD-10-CM | POA: Insufficient documentation

## 2017-01-07 DIAGNOSIS — R51 Headache: Secondary | ICD-10-CM

## 2017-01-07 DIAGNOSIS — R519 Headache, unspecified: Secondary | ICD-10-CM

## 2017-01-07 DIAGNOSIS — J329 Chronic sinusitis, unspecified: Secondary | ICD-10-CM | POA: Diagnosis not present

## 2017-01-07 MED ORDER — AMOXICILLIN-POT CLAVULANATE 875-125 MG PO TABS: 1.0000 | ORAL_TABLET | Freq: Two times a day (BID) | ORAL | 0 refills | Status: DC

## 2017-01-07 MED ORDER — AMOXICILLIN-POT CLAVULANATE 875-125 MG PO TABS
1.0000 | ORAL_TABLET | Freq: Once | ORAL | Status: AC
  Administered 2017-01-07: 1 via ORAL
  Filled 2017-01-07: qty 1

## 2017-01-07 MED ORDER — FLUTICASONE PROPIONATE 50 MCG/ACT NA SUSP: 1.0000 | Freq: Every day | NASAL | 2 refills | Status: DC

## 2017-01-07 NOTE — ED Triage Notes (Signed)
Patient complains of 2 days of frontal headache with fatigue and chills. Taking ibuprofen with mild relief. Alert and oriented, NAD

## 2017-01-07 NOTE — ED Provider Notes (Signed)
[facility] Provider Note   CSN: [id number] Arrival date & time: 01/07/17  1401     History   Chief Complaint Chief Complaint  Patient presents with  . chills/headache    HPI Stephen Warner is a 31 y.o. male.  HPI   31 year old male presents today with complaints of chills, headache and fatigue.  Patient reports approximate 3 weeks of fatigue, noting that he felt like he was getting sick.  He notes this improved, but then again 4 days ago had return of chills, headache and fatigue.  He describes this is frontal along the forehead, worse with leaning forward, denies any radiation of symptoms.  He denies any neurological deficits, neck stiffness.  No objective fevers at home.  He reports his children have been sick recently.  She notes the same approximately 3 years after a diagnosis of sinusitis.     Past Medical History:  Diagnosis Date  . Bronchitis     Patient Active Problem List   Diagnosis Date Noted  . Paronychia of finger 10/05/2014    History reviewed. No pertinent surgical history.    Home Medications    Prior to Admission medications   Medication Sig Start Date End Date Taking? Authorizing Provider  acetaminophen (TYLENOL) 500 MG tablet Take 1,000 mg by mouth every 6 (six) hours as needed for moderate pain.    [provider]  albuterol (PROVENTIL HFA;VENTOLIN HFA) 108 (90 BASE) MCG/ACT inhaler Inhale 2 puffs into the lungs every 4 (four) hours as needed for wheezing. 07/12/12   Elson AreasSofia, Leslie K, PA-C  amoxicillin-clavulanate (AUGMENTIN) 875-125 MG tablet Take 1 tablet by mouth every 12 (twelve) hours. 01/07/17   Branden Shallenberger, Tinnie GensJeffrey, PA-C  Aspirin Effervescent (ALKA-SELTZER PO) Take 2 tablets by mouth once.    [provider]  bacitracin ointment Apply 1 application topically 2 (two) times daily. 09/25/14   Muthersbaugh, Dahlia ClientHannah, PA-C  benzonatate (TESSALON) 100 MG capsule Take 1 capsule (100 mg total) by mouth every  8 (eight) hours. 02/18/16   Kirichenko, Tatyana, PA-C  cephALEXin (KEFLEX) 500 MG capsule Take 1 capsule (500 mg total) by mouth 4 (four) times daily. 09/25/14   Muthersbaugh, Dahlia ClientHannah, PA-C  fluticasone (FLONASE) 50 MCG/ACT nasal spray Place 1 spray into both nostrils daily. 01/07/17   Xenia Nile, Tinnie GensJeffrey, PA-C  guaiFENesin-dextromethorphan (ROBITUSSIN DM) 100-10 MG/5ML syrup Take 5 mLs by mouth every 4 (four) hours as needed for cough. 02/18/16   Kirichenko, Tatyana, PA-C  HYDROcodone-homatropine (HYCODAN) 5-1.5 MG/5ML syrup Take 5 mLs by mouth every 6 (six) hours as needed. Patient not taking: Reported on 10/04/2014 04/06/13   Emilia BeckSzekalski, Kaitlyn, PA-C  ibuprofen (ADVIL,MOTRIN) 800 MG tablet Take 1 tablet (800 mg total) by mouth 3 (three) times daily. As needed for pain 09/25/14   Muthersbaugh, Dahlia ClientHannah, PA-C  ipratropium (ATROVENT) 0.06 % nasal spray Place 2 sprays into both nostrils 4 (four) times daily. Patient not taking: Reported on 10/04/2014 12/21/13   Ozella RocksMerrell, David J, MD    Family History Family History  Problem Relation Age of Onset  . Diabetes Paternal Uncle   . Diabetes Paternal Grandfather     Social History Social History   Tobacco Use  . Smoking status: Former Smoker    Types: Cigars    Last attempt to quit: 02/02/2013    Years since quitting: 3.9  . Smokeless tobacco: Never Used  Substance Use Topics  . Alcohol use: No  . Drug use: No    Comment: none since 02/02/2013  Allergies   Patient has no known allergies.   Review of Systems Review of Systems  All other systems reviewed and are negative.   Physical Exam Updated Vital Signs BP 120/72 (BP Location: Right Arm)   Pulse 96   Temp 99.7 F (37.6 C) (Oral)   Resp 16   SpO2 99%   Physical Exam  Constitutional: He is oriented to person, place, and time. He appears well-developed and well-nourished.  HENT:  Head: Normocephalic and atraumatic.  Right Ear: External ear normal.  Left Ear: External ear normal.  Nose:  Nose normal.  Mouth/Throat: Oropharynx is clear and moist. No oropharyngeal exudate.  Eyes: Conjunctivae are normal. Pupils are equal, round, and reactive to light. Right eye exhibits no discharge. Left eye exhibits no discharge. No scleral icterus.  Neck: Normal range of motion. No JVD present. No tracheal deviation present.  Pulmonary/Chest: Effort normal and breath sounds normal. No stridor. No respiratory distress. He has no wheezes. He has no rales. He exhibits no tenderness.  Neurological: He is alert and oriented to person, place, and time. Coordination normal.  Psychiatric: He has a normal mood and affect. His behavior is normal. Judgment and thought content normal.  Nursing note and vitals reviewed.    ED Treatments / Results  Labs (all labs ordered are listed, but only abnormal results are displayed) Labs Reviewed - No data to display  EKG  EKG Interpretation None       Radiology No results found.  Procedures Procedures (including critical care time)  Medications Ordered in ED Medications  amoxicillin-clavulanate (AUGMENTIN) 875-125 MG per tablet 1 tablet (1 tablet Oral Given 01/07/17 1906)     Initial Impression / Assessment and Plan / ED Course  I have reviewed the triage vital signs and the nursing notes.  Pertinent labs & imaging results that were available during my care of the patient were reviewed by me and considered in my medical decision making (see chart for details).     Final Clinical Impressions(s) / ED Diagnoses   Final diagnoses:  Sinus headache    Labs:   Imaging:  Consults:  Therapeutics:  Discharge Meds:   Assessment/Plan: Patient's presentation is most consistent with sinusitis.  Patient will be treated for bacterial sinusitis given his low-grade fever and persistence of symptoms.  Patient without any neurological deficits low suspicion for intracranial abnormality.  Patient will follow-up as an outpatient, return immediately with  any new or worsening signs or symptoms.  He verbalized understanding and agreement to today's plan had no further questions or concerns at the time of discharge.      ED Discharge Orders        Ordered    amoxicillin-clavulanate (AUGMENTIN) 875-125 MG tablet  Every 12 hours     01/07/17 1905    fluticasone (FLONASE) 50 MCG/ACT nasal spray  Daily     01/07/17 1905       Rosalio LoudHedges, Meiling Hendriks, PA-C 01/07/17 2118    Shaune PollackIsaacs, Cameron, MD 01/08/17 [id number]

## 2017-01-07 NOTE — Discharge Instructions (Signed)
Please read attached information. If you experience any new or worsening signs or symptoms please return to the emergency room for evaluation. Please follow-up with your primary care provider or specialist as discussed. Please use medication prescribed only as directed and discontinue taking if you have any concerning signs or symptoms.   °

## 2017-01-07 NOTE — ED Notes (Signed)
Informed Jeff - PA of pt's temp.

## 2017-02-09 ENCOUNTER — Encounter (HOSPITAL_COMMUNITY): Payer: Self-pay | Admitting: Emergency Medicine

## 2017-02-09 ENCOUNTER — Other Ambulatory Visit: Payer: Self-pay

## 2017-02-09 ENCOUNTER — Emergency Department (HOSPITAL_COMMUNITY)
Admission: EM | Admit: 2017-02-09 | Discharge: 2017-02-09 | Disposition: A | Discharge status: Home / Self Care | Payer: Managed Care, Other (non HMO) | Attending: Emergency Medicine | Admitting: Emergency Medicine

## 2017-02-09 ENCOUNTER — Emergency Department (HOSPITAL_COMMUNITY): Payer: Managed Care, Other (non HMO)

## 2017-02-09 DIAGNOSIS — B349 Viral infection, unspecified: Secondary | ICD-10-CM | POA: Insufficient documentation

## 2017-02-09 DIAGNOSIS — Z79899 Other long term (current) drug therapy: Secondary | ICD-10-CM | POA: Diagnosis not present

## 2017-02-09 DIAGNOSIS — R509 Fever, unspecified: Secondary | ICD-10-CM | POA: Diagnosis present

## 2017-02-09 DIAGNOSIS — Z87891 Personal history of nicotine dependence: Secondary | ICD-10-CM | POA: Diagnosis not present

## 2017-02-09 LAB — URINALYSIS, ROUTINE W REFLEX MICROSCOPIC
BILIRUBIN URINE: NEGATIVE
Glucose, UA: NEGATIVE mg/dL
Hgb urine dipstick: NEGATIVE
KETONES UR: 20 mg/dL — AB
Leukocytes, UA: NEGATIVE
Nitrite: NEGATIVE
PROTEIN: 30 mg/dL — AB
Specific Gravity, Urine: 1.031 — ABNORMAL HIGH (ref 1.005–1.030)
pH: 5 (ref 5.0–8.0)

## 2017-02-09 LAB — INFLUENZA PANEL BY PCR (TYPE A & B)
INFLBPCR: NEGATIVE
Influenza A By PCR: NEGATIVE

## 2017-02-09 LAB — I-STAT CG4 LACTIC ACID, ED: LACTIC ACID, VENOUS: 0.49 mmol/L — AB (ref 0.5–1.9)

## 2017-02-09 LAB — COMPREHENSIVE METABOLIC PANEL
ALT: 15 U/L — AB (ref 17–63)
AST: 24 U/L (ref 15–41)
Albumin: 3.6 g/dL (ref 3.5–5.0)
Alkaline Phosphatase: 35 U/L — ABNORMAL LOW (ref 38–126)
Anion gap: 8 (ref 5–15)
BUN: 8 mg/dL (ref 6–20)
CHLORIDE: 104 mmol/L (ref 101–111)
CO2: 24 mmol/L (ref 22–32)
CREATININE: 0.94 mg/dL (ref 0.61–1.24)
Calcium: 8.6 mg/dL — ABNORMAL LOW (ref 8.9–10.3)
GFR calc non Af Amer: >60 mL/min (ref >60)
Glucose, Bld: 116 mg/dL — ABNORMAL HIGH (ref 65–99)
POTASSIUM: 3.2 mmol/L — AB (ref 3.5–5.1)
SODIUM: 136 mmol/L (ref 135–145)
Total Bilirubin: 1 mg/dL (ref 0.3–1.2)
Total Protein: 6.5 g/dL (ref 6.5–8.1)

## 2017-02-09 LAB — CBC
HEMATOCRIT: 38.8 % — AB (ref 39.0–52.0)
HEMOGLOBIN: 12.5 g/dL — AB (ref 13.0–17.0)
MCH: 28.5 pg (ref 26.0–34.0)
MCHC: 32.2 g/dL (ref 30.0–36.0)
MCV: 88.4 fL (ref 78.0–100.0)
Platelets: 172 10*3/uL (ref 150–400)
RBC: 4.39 MIL/uL (ref 4.22–5.81)
RDW: 12.5 % (ref 11.5–15.5)
WBC: 2.8 10*3/uL — ABNORMAL LOW (ref 4.0–10.5)

## 2017-02-09 LAB — LIPASE, BLOOD: LIPASE: 39 U/L (ref 11–51)

## 2017-02-09 MED ORDER — IBUPROFEN 800 MG PO TABS: 800.0000 mg | ORAL_TABLET | Freq: Three times a day (TID) | ORAL | 0 refills | Status: DC | PRN

## 2017-02-09 MED ORDER — POTASSIUM CHLORIDE CRYS ER 20 MEQ PO TBCR
40.0000 meq | EXTENDED_RELEASE_TABLET | Freq: Once | ORAL | Status: AC
  Administered 2017-02-09: 40 meq via ORAL
  Filled 2017-02-09: qty 2

## 2017-02-09 MED ORDER — IBUPROFEN 200 MG PO TABS
600.0000 mg | ORAL_TABLET | Freq: Once | ORAL | Status: AC
  Administered 2017-02-09: 600 mg via ORAL
  Filled 2017-02-09: qty 1

## 2017-02-09 NOTE — ED Notes (Signed)
No answer for vitals  

## 2017-02-09 NOTE — Discharge Instructions (Signed)
It was my pleasure taking care of you today!   Alternate between Tylenol and Motrin every four hours as needed for fever.  Increase hydration.  Follow up with your primary care doctor if symptoms not improving in 2 days.  Return to ER for new or worsening symptoms, any additional concerns.

## 2017-02-09 NOTE — ED Provider Notes (Signed)
[facility] Provider Note   CSN: [id number] Arrival date & time: 02/09/17  1521     History   Chief Complaint Chief Complaint  Patient presents with  . Fever  . Emesis    HPI Stephen CapraStacy Yaworski is a 32 y.o. male.  The history is provided by the patient and medical records. No language interpreter was used.   Stephen CapraStacy Warner is a 32 y.o. male  with a PMH of bronchitis who presents to the Emergency Department complaining of persistent intermittently productive cough, cough, subjective fever which began 5 days ago. Yesterday, he had nausea and two episodes of emesis, but no nausea or vomiting today. Took Tylenol an hour or so prior to arrival. Denies alleviating or aggravating factors. No known sick contacts. No abdominal pain, shortness of breath, chest pain, diarrhea, constipation, urinary symptoms.  Past Medical History:  Diagnosis Date  . Bronchitis     Patient Active Problem List   Diagnosis Date Noted  . Paronychia of finger 10/05/2014    History reviewed. No pertinent surgical history.     Home Medications    Prior to Admission medications   Medication Sig Start Date End Date Taking? Authorizing Provider  acetaminophen (TYLENOL) 500 MG tablet Take 1,000 mg by mouth every 6 (six) hours as needed for moderate pain.   Yes [provider]  albuterol (PROVENTIL HFA;VENTOLIN HFA) 108 (90 BASE) MCG/ACT inhaler Inhale 2 puffs into the lungs every 4 (four) hours as needed for wheezing. 07/12/12  Yes Cheron SchaumannSofia, Leslie K, PA-C  amoxicillin-clavulanate (AUGMENTIN) 875-125 MG tablet Take 1 tablet by mouth every 12 (twelve) hours. 01/07/17  Yes Hedges, Tinnie GensJeffrey, PA-C  bacitracin ointment Apply 1 application topically 2 (two) times daily. 09/25/14   Muthersbaugh, Dahlia ClientHannah, PA-C  benzonatate (TESSALON) 100 MG capsule Take 1 capsule (100 mg total) by mouth every 8 (eight) hours. Patient not taking: Reported on 02/09/2017 02/18/16   Jaynie CrumbleKirichenko, Tatyana, PA-C    cephALEXin (KEFLEX) 500 MG capsule Take 1 capsule (500 mg total) by mouth 4 (four) times daily. Patient not taking: Reported on 02/09/2017 09/25/14   Muthersbaugh, Dahlia ClientHannah, PA-C  fluticasone [facility](FLONASE) 50 MCG/ACT nasal spray Place 1 spray into both nostrils daily. Patient not taking: Reported on 02/09/2017 01/07/17   Hedges, Tinnie GensJeffrey, PA-C  guaiFENesin-dextromethorphan (ROBITUSSIN DM) 100-10 MG/5ML syrup Take 5 mLs by mouth every 4 (four) hours as needed for cough. Patient not taking: Reported on 02/09/2017 02/18/16   Jaynie CrumbleKirichenko, Tatyana, PA-C  HYDROcodone-homatropine (HYCODAN) 5-1.5 MG/5ML syrup Take 5 mLs by mouth every 6 (six) hours as needed. Patient not taking: Reported on 10/04/2014 04/06/13   Emilia BeckSzekalski, Kaitlyn, PA-C  ibuprofen (ADVIL,MOTRIN) 800 MG tablet Take 1 tablet (800 mg total) by mouth every 8 (eight) hours as needed for fever. 02/09/17   Lynee Rosenbach, Chase PicketJaime Pilcher, PA-C  ipratropium (ATROVENT) 0.06 % nasal spray Place 2 sprays into both nostrils 4 (four) times daily. Patient not taking: Reported on 10/04/2014 12/21/13   Ozella RocksMerrell, David J, MD    Family History Family History  Problem Relation Age of Onset  . Diabetes Paternal Uncle   . Diabetes Paternal Grandfather     Social History Social History   Tobacco Use  . Smoking status: Former Smoker    Types: Cigars    Last attempt to quit: 02/02/2013    Years since quitting: 4.0  . Smokeless tobacco: Never Used  Substance Use Topics  . Alcohol use: No  . Drug use: No    Comment: none since  02/02/2013     Allergies   Patient has no known allergies.   Review of Systems Review of Systems  Constitutional: Positive for chills and fever.  HENT: Positive for congestion. Negative for sore throat.   Respiratory: Positive for cough. Negative for shortness of breath.   Gastrointestinal: Positive for nausea and vomiting. Negative for abdominal pain, blood in stool, constipation and diarrhea.  All other systems reviewed and are  negative.    Physical Exam Updated Vital Signs BP 120/77 (BP Location: Right Arm)   Pulse 86   Temp 99.3 F (37.4 C) (Oral)   Resp 14   Ht 5\' 10"  (1.778 m)   Wt 79.4 kg (175 lb)   SpO2 100%   BMI 25.11 kg/m   Physical Exam  Constitutional: He is oriented to person, place, and time. He appears well-developed and well-nourished. No distress.  HENT:  Head: Normocephalic and atraumatic.  OP with erythema, no exudates or tonsillar hypertrophy. + nasal congestion with mucosal edema.   Neck: Normal range of motion. Neck supple.  No meningeal signs.   Cardiovascular: Normal rate, regular rhythm and normal heart sounds.  Pulmonary/Chest: Effort normal.  Lungs are clear to auscultation bilaterally - no w/r/r  Abdominal: Soft. He exhibits no distension. There is no tenderness.  Musculoskeletal: Normal range of motion.  Neurological: He is alert and oriented to person, place, and time.  Skin: Skin is warm and dry. He is not diaphoretic.  Nursing note and vitals reviewed.    ED Treatments / Results  Labs (all labs ordered are listed, but only abnormal results are displayed) Labs Reviewed  COMPREHENSIVE METABOLIC PANEL - Abnormal; Notable for the following components:      Result Value   Potassium 3.2 (*)    Glucose, Bld 116 (*)    Calcium 8.6 (*)    ALT 15 (*)    Alkaline Phosphatase 35 (*)    All other components within normal limits  CBC - Abnormal; Notable for the following components:   WBC 2.8 (*)    Hemoglobin 12.5 (*)    HCT 38.8 (*)    All other components within normal limits  URINALYSIS, ROUTINE W REFLEX MICROSCOPIC - Abnormal; Notable for the following components:   Specific Gravity, Urine 1.031 (*)    Ketones, ur 20 (*)    Protein, ur 30 (*)    Bacteria, UA RARE (*)    Squamous Epithelial / LPF 0-5 (*)    All other components within normal limits  I-STAT CG4 LACTIC ACID, ED - Abnormal; Notable for the following components:   Lactic Acid, Venous 0.49 (*)     All other components within normal limits  LIPASE, BLOOD  INFLUENZA PANEL BY PCR (TYPE A & B)    EKG  EKG Interpretation None       Radiology Dg Chest 2 View  Result Date: 02/09/2017 CLINICAL DATA:  Upper respiratory infection over the last 2 weeks. Fever and cough. EXAM: CHEST  2 VIEW COMPARISON:  02/18/2016 FINDINGS: Heart size is normal. Mediastinal shadows are normal. The lungs are clear. No bronchial thickening. No infiltrate, mass, effusion or collapse. Pulmonary vascularity is normal. No bony abnormality. IMPRESSION: Normal chest Electronically Signed   By: Paulina Fusi M.D.   On: 02/09/2017 17:21    Procedures Procedures (including critical care time)  Medications Ordered in ED Medications  ibuprofen (ADVIL,MOTRIN) tablet 600 mg (600 mg Oral Given 02/09/17 1547)  potassium chloride SA (K-DUR,KLOR-CON) CR tablet 40 mEq (40 mEq  Oral Given 02/09/17 2121)     Initial Impression / Assessment and Plan / ED Course  I have reviewed the triage vital signs and the nursing notes.  Pertinent labs & imaging results that were available during my care of the patient were reviewed by me and considered in my medical decision making (see chart for details).    Jadyn Barge is a 32 y.o. male who presents to ED for fever, cough, congestion. Febrile in ED which responded well to Motrin.   On exam, patient is non-toxic appearing with a clear lung exam. OP a little red, but no exudates or hypertrophy.   CXR negative. Flu negative. Labs reviewed. Hypokalemia at 3.2 which was replenished in ED.  Sxs today likely due to viral syndrome. Symptomatic home care instructions discussed. PCP follow up strongly encouraged if symptoms persist. Reasons to return to ER discussed. All questions answered.   Blood pressure 120/77, pulse 86, temperature 99.3 F (37.4 C), temperature source Oral, resp. rate 14, height 5\' 10"  (1.778 m), weight 79.4 kg (175 lb), SpO2 100 %.   Final Clinical Impressions(s) / ED  Diagnoses   Final diagnoses:  Viral syndrome    ED Discharge Orders        Ordered    ibuprofen (ADVIL,MOTRIN) 800 MG tablet  Every 8 hours PRN     02/09/17 2123       Elissa Grieshop, Chase Picket, PA-C 02/09/17 2125    Gerhard Munch, MD 02/10/17 0021

## 2017-02-09 NOTE — ED Triage Notes (Signed)
Pt c/o generalized body aches with nausea/vomiting since Friday. Temp 101 in triage.

## 2017-02-09 NOTE — ED Notes (Signed)
Lab work, radiology results and vital signs reviewed, no critical results at this time, no change in acuity indicated.  

## 2017-02-09 NOTE — ED Provider Notes (Signed)
Patient placed in Quick Look pathway, seen and evaluated for chief complaint of cough/URI symptoms x 2 weeks.  Pertinent H&P findings include fever, cough, no CP/SOB. No abdominal pain. No N/V/D.  Based on initial evaluation, labs are indicated and radiology studies are indicated.  Patient counseled on process, plan, and necessity for staying for completing the evaluation.    Audry PiliMohr, Tayson Schnelle, PA-C 02/09/17 1543    Margarita Grizzleay, Danielle, MD 02/10/17 1455

## 2017-02-09 NOTE — ED Notes (Signed)
Pt tolerating water without difficulty.

## 2018-02-15 IMAGING — DX DG CHEST 2V
2 series · 2 of 2 positions shown · non-contrast
Comparison: April 23, 2014

CLINICAL DATA: Cough

EXAM:
CHEST  2 VIEW

[w chest pa]
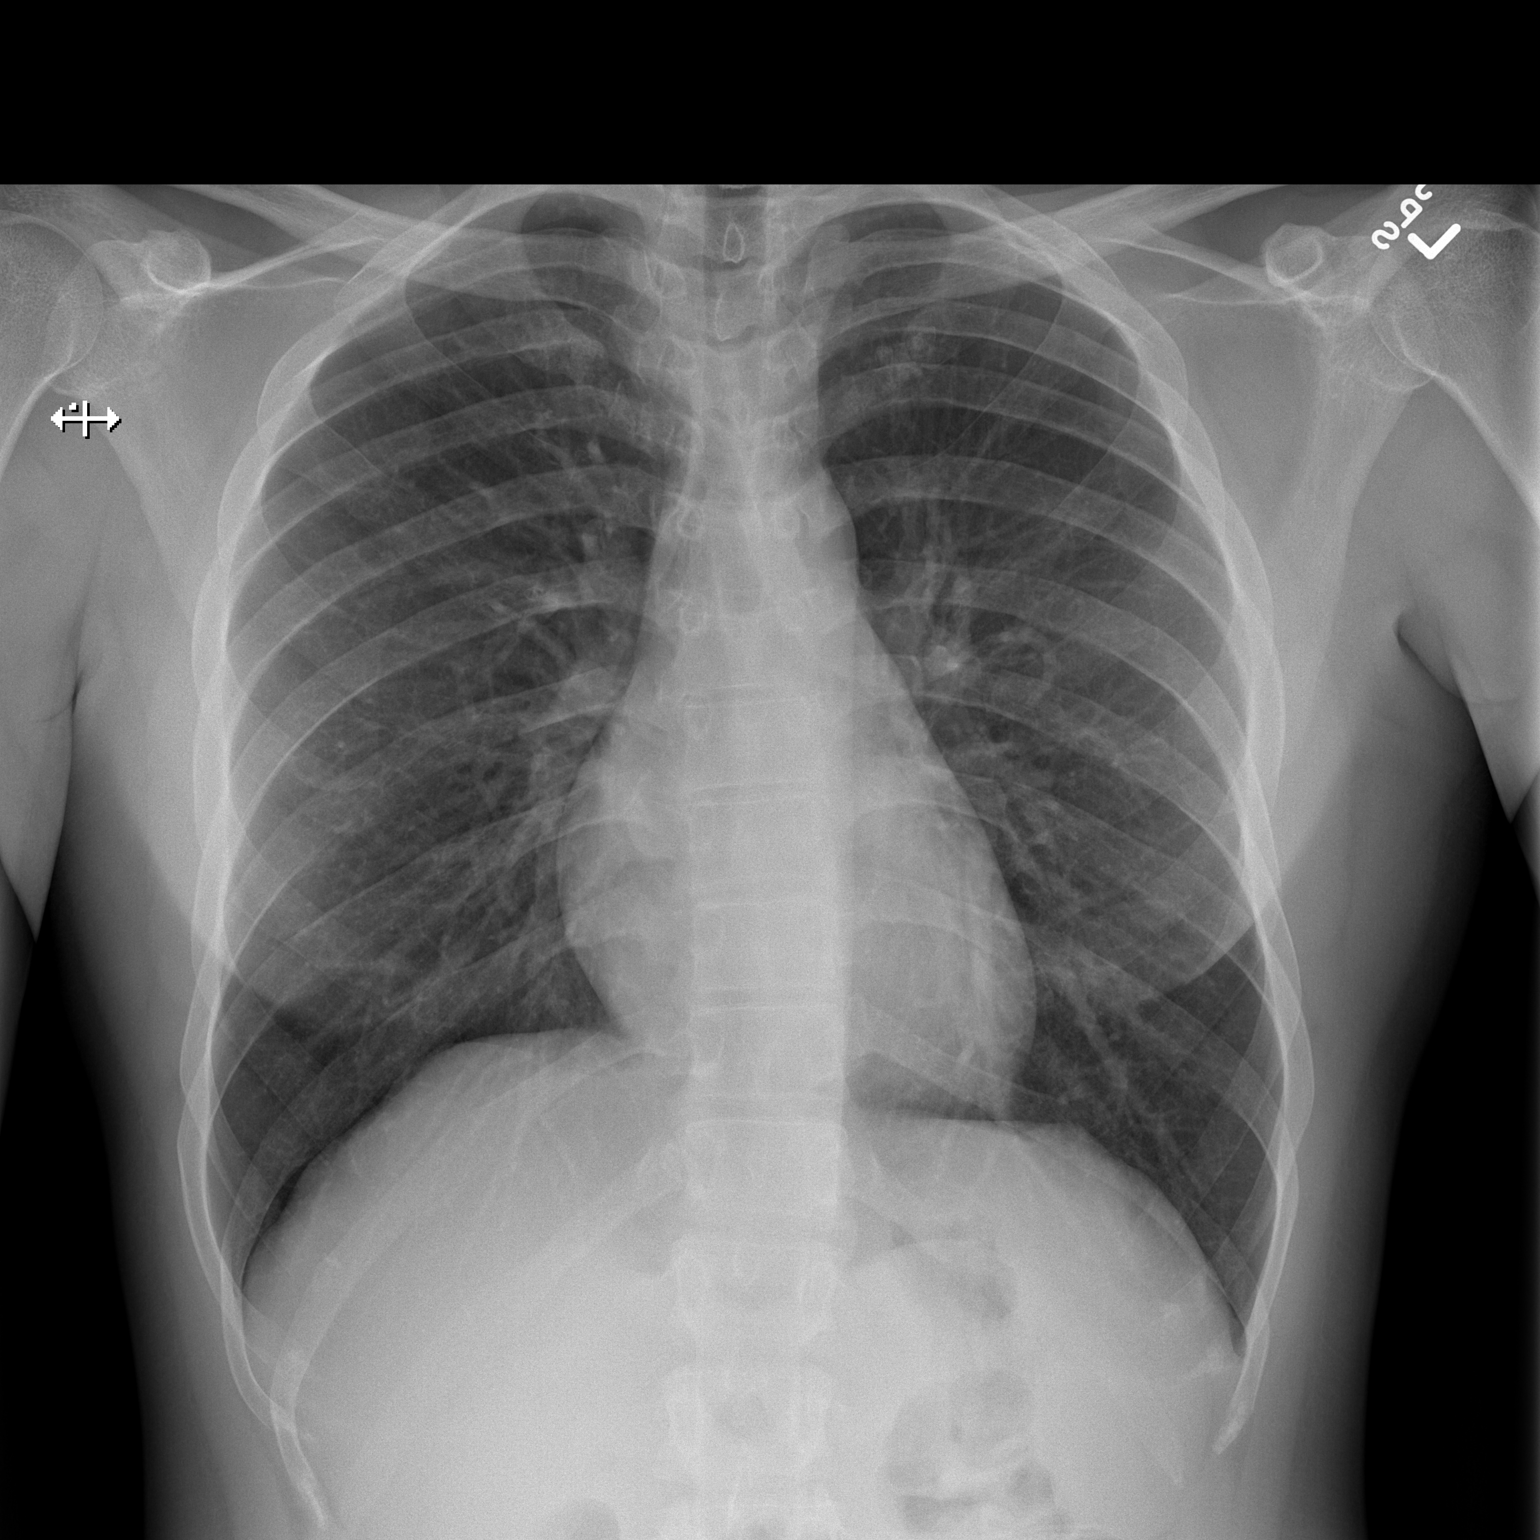

[w chest lat]
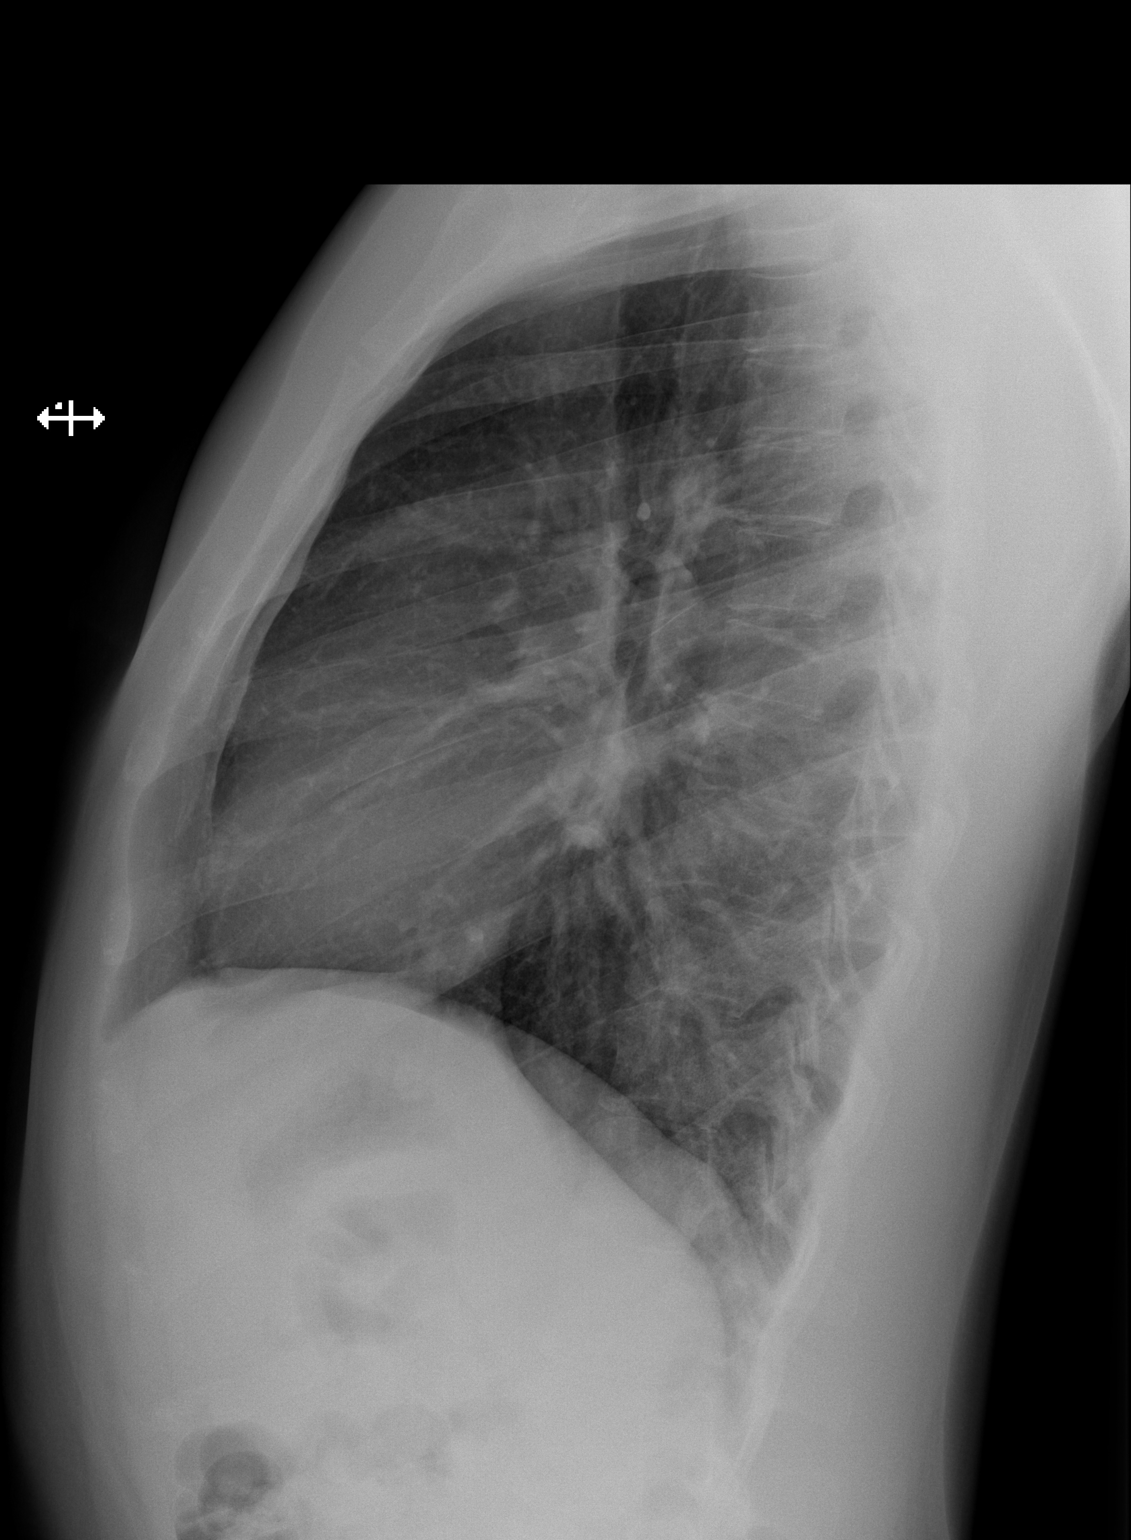

[2 of 2 positions shown; findings below may reference images not displayed]

FINDINGS: Lungs are clear. Heart size and pulmonary vascularity are normal. No
adenopathy. There is slight lower thoracic levoscoliosis.
IMPRESSION: No edema or consolidation.

## 2019-02-07 IMAGING — DX DG CHEST 2V
2 series · 2 of 2 positions shown · non-contrast
Comparison: 02/18/2016

CLINICAL DATA: Upper respiratory infection over the last 2 weeks.
Fever and cough.

EXAM:
CHEST  2 VIEW

[chest pa]
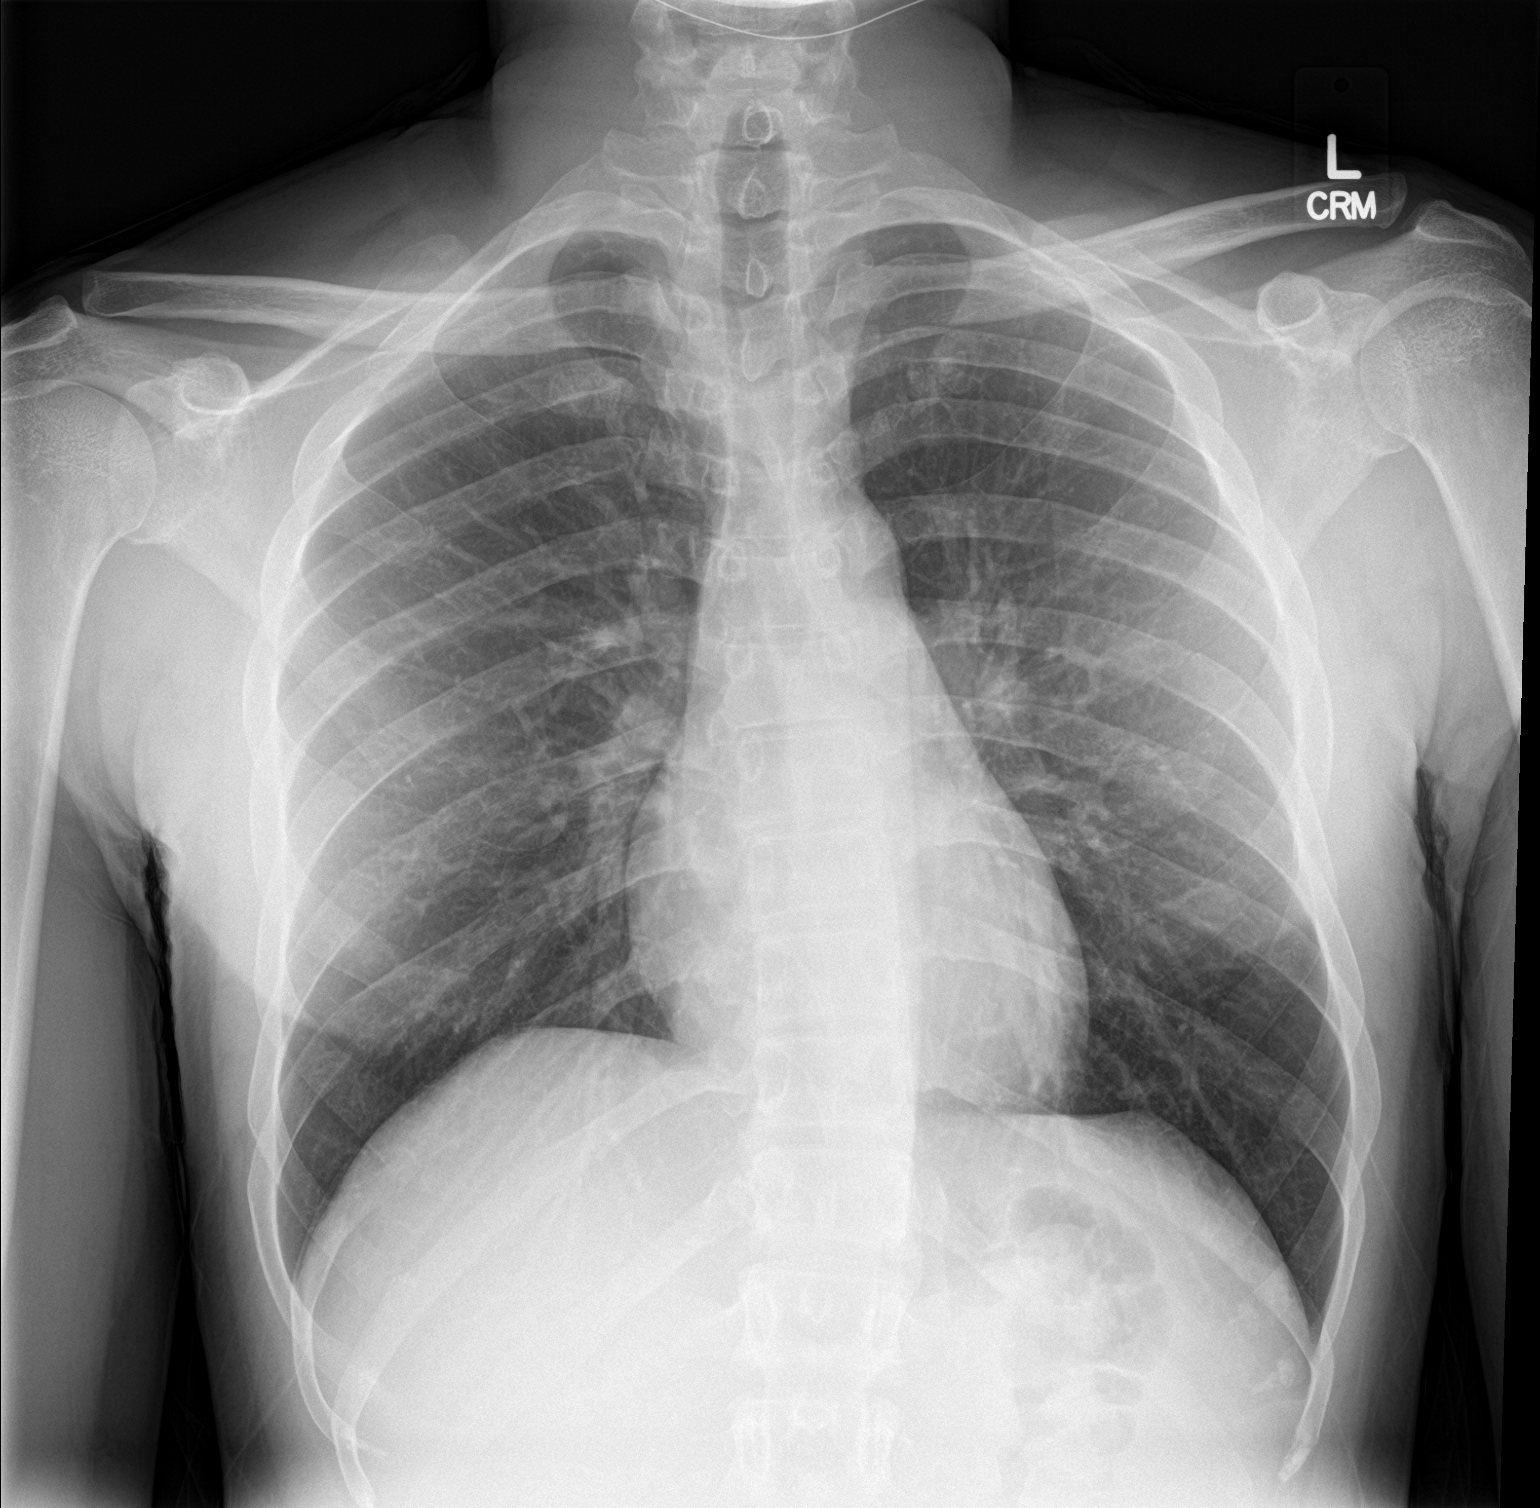

[chest lat]
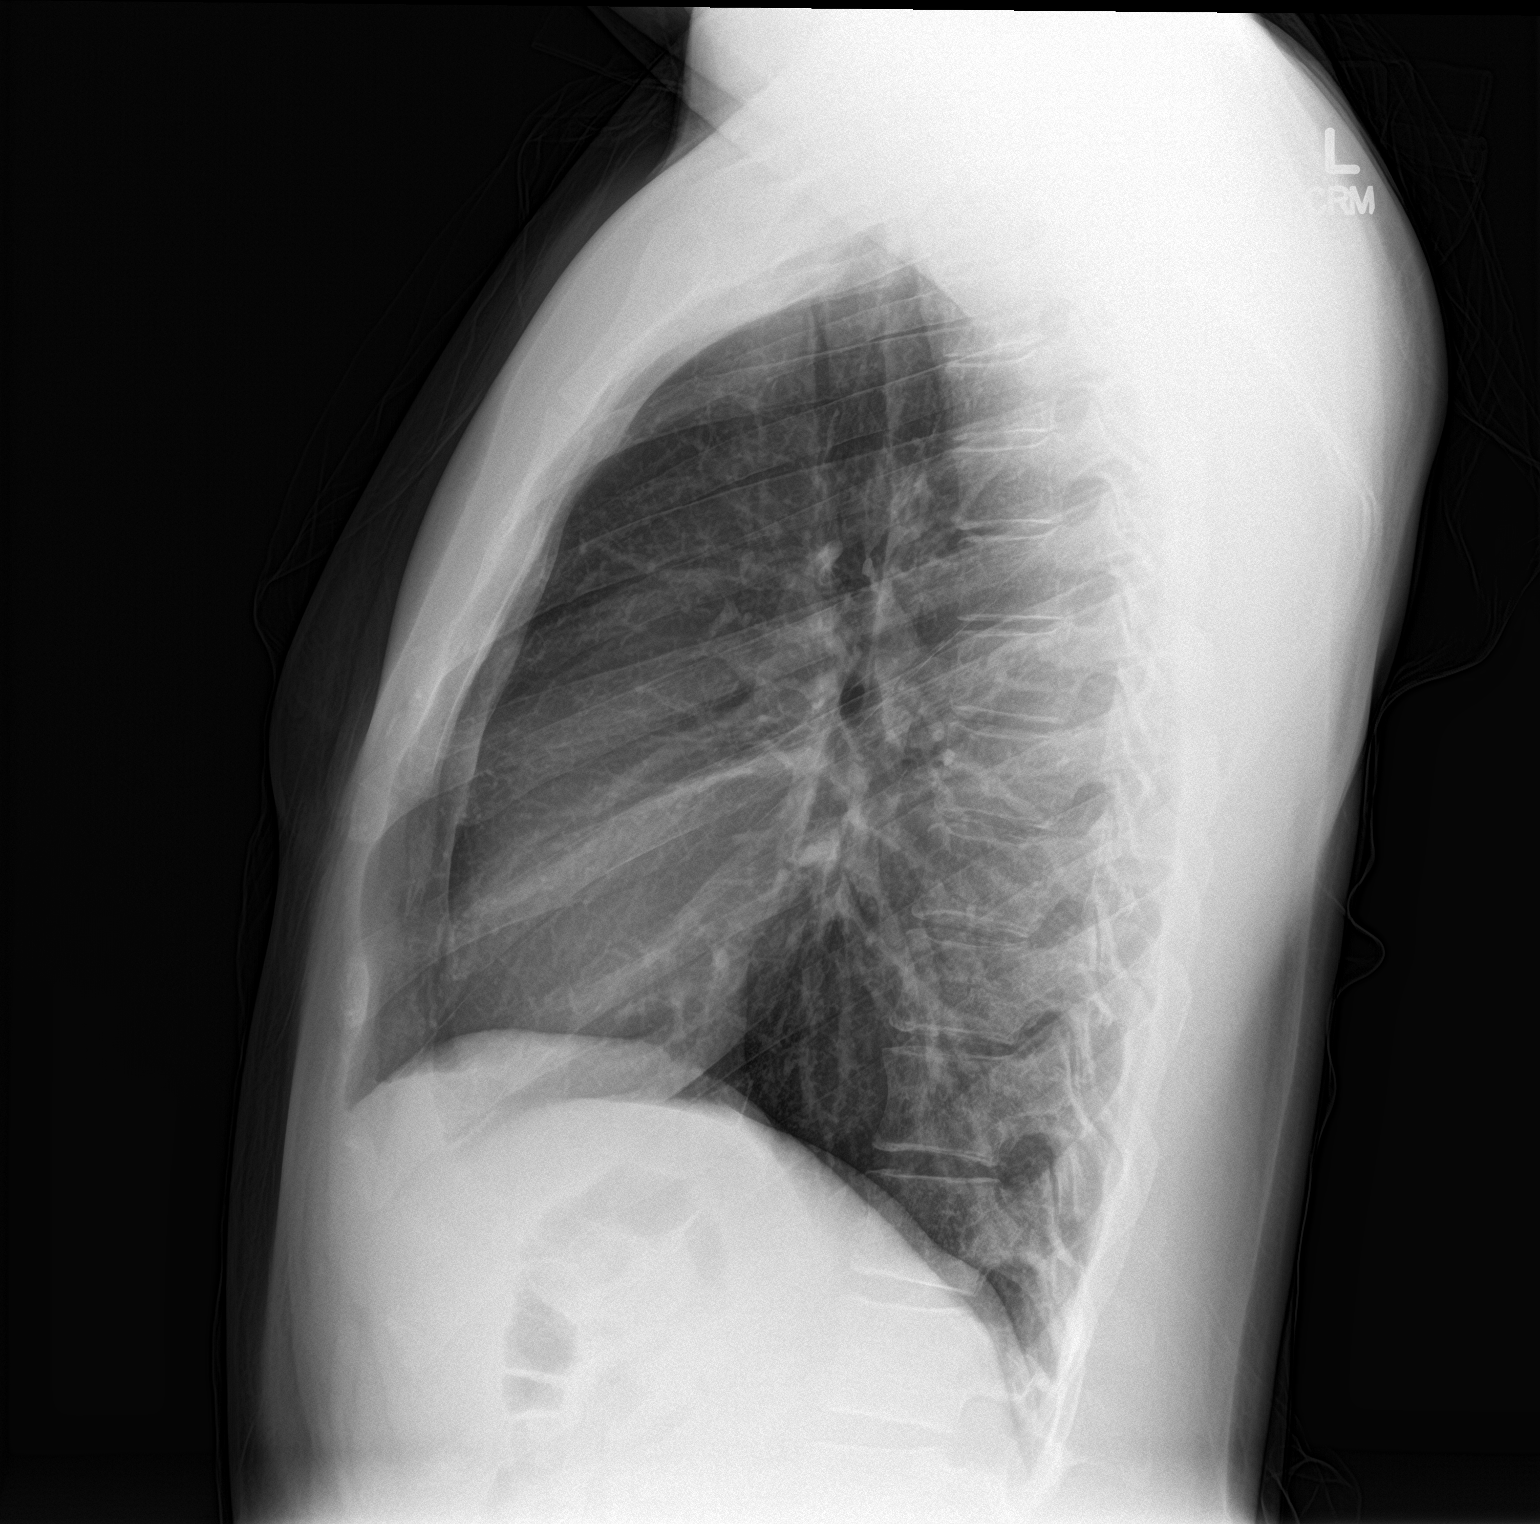

[2 of 2 positions shown; findings below may reference images not displayed]

FINDINGS: Heart size is normal. Mediastinal shadows are normal. The lungs are
clear. No bronchial thickening. No infiltrate, mass, effusion or
collapse. Pulmonary vascularity is normal. No bony abnormality.
IMPRESSION: Normal chest

## 2020-12-14 ENCOUNTER — Encounter (HOSPITAL_BASED_OUTPATIENT_CLINIC_OR_DEPARTMENT_OTHER): Payer: Self-pay | Admitting: *Deleted

## 2020-12-14 ENCOUNTER — Other Ambulatory Visit: Payer: Self-pay

## 2020-12-14 ENCOUNTER — Emergency Department (HOSPITAL_BASED_OUTPATIENT_CLINIC_OR_DEPARTMENT_OTHER)
Admission: EM | Admit: 2020-12-14 | Discharge: 2020-12-14 | Disposition: A | Discharge status: Home / Self Care | Payer: Managed Care, Other (non HMO) | Attending: Emergency Medicine | Admitting: Emergency Medicine

## 2020-12-14 DIAGNOSIS — Z87891 Personal history of nicotine dependence: Secondary | ICD-10-CM | POA: Insufficient documentation

## 2020-12-14 DIAGNOSIS — N342 Other urethritis: Secondary | ICD-10-CM | POA: Insufficient documentation

## 2020-12-14 LAB — URINALYSIS, ROUTINE W REFLEX MICROSCOPIC
Bilirubin Urine: NEGATIVE
Glucose, UA: NEGATIVE mg/dL
Ketones, ur: NEGATIVE mg/dL
Nitrite: NEGATIVE
Protein, ur: NEGATIVE mg/dL
Specific Gravity, Urine: 1.025 (ref 1.005–1.030)
pH: 6 (ref 5.0–8.0)

## 2020-12-14 LAB — URINALYSIS, MICROSCOPIC (REFLEX): WBC, UA: >50 WBC/hpf (ref 0–5)

## 2020-12-14 MED ORDER — CEFTRIAXONE SODIUM 500 MG IJ SOLR
500.0000 mg | Freq: Once | INTRAMUSCULAR | Status: AC
  Administered 2020-12-14: 500 mg via INTRAMUSCULAR
  Filled 2020-12-14: qty 500

## 2020-12-14 MED ORDER — DOXYCYCLINE HYCLATE 100 MG PO CAPS: 100.0000 mg | ORAL_CAPSULE | Freq: Two times a day (BID) | ORAL | 0 refills | Status: DC

## 2020-12-14 NOTE — ED Triage Notes (Addendum)
Pt reports recent encounter with old gf. States since then has had frequent urination and discharge. Also reports lower abdominal pain

## 2020-12-14 NOTE — Discharge Instructions (Signed)
Please read and follow all provided instructions.  Your diagnoses today include:  1. Urethritis     Tests performed today include: Test for gonorrhea and chlamydia.  Test for HIV and syphilis.  You will be notified by telephone with any positive results.  Vital signs. See below for your results today.   Medications:  For treatment of gonorrhea: You were treated with a rocephin (shot) today.   For treatment of chlamydia: You were given a prescription for doxycycline which you will need to take twice a day for 7 days.  Home care instructions:  Read educational materials contained in this packet and follow any instructions provided.   You should tell your partners about your infection and avoid having sex for one week to allow time for the medicine to work.  Sexually transmitted disease testing also available at:  [facility] [address], [place], phone [phone] or [phone]   Monday - Friday, call for an appointment  Return instructions:  Please return to the Emergency Department if you experience worsening symptoms.  Please return if you have any other emergent concerns.  Additional Information:  Your vital signs today were: BP 131/77 (BP Location: Left Arm)   Pulse 92   Temp 98.6 F (37 C) (Oral)   Resp 16   Ht 5\' 10"  (1.778 m)   Wt 78 kg   SpO2 99%   BMI 24.68 kg/m  If your blood pressure (BP) was elevated above 135/85 this visit, please have this repeated by your doctor within one month. --------------

## 2020-12-14 NOTE — ED Provider Notes (Signed)
[facility] EMERGENCY DEPARTMENT Provider Note   CSN: [id number] Arrival date & time: 12/14/20  1514     History Chief Complaint  Patient presents with   Penile Discharge   Abdominal Pain    Stephen Warner is a 35 y.o. male.  Patient presents to the emergency department for evaluation of dysuria and penile discharge.  Patient first noted dysuria a couple of days ago and yesterday started with yellow-colored discharge.  He has had some mild suprapubic pain.  No fevers, nausea or vomiting.  Patient reports a unprotected sexual encounter with a male partner.  No sores or lesions noted.  No other complaints.      Past Medical History:  Diagnosis Date   Bronchitis     Patient Active Problem List   Diagnosis Date Noted   Paronychia of finger 10/05/2014    History reviewed. No pertinent surgical history.     Family History  Problem Relation Age of Onset   Diabetes Paternal Uncle    Diabetes Paternal Grandfather     Social History   Tobacco Use   Smoking status: Former    Types: Cigars    Quit date: 02/02/2013    Years since quitting: 7.8   Smokeless tobacco: Never  Vaping Use   Vaping Use: Never used  Substance Use Topics   Alcohol use: Yes    Comment: occasional   Drug use: Yes    Frequency: 21.0 times per week    Types: Marijuana    Comment: occasional    Home Medications Prior to Admission medications   Medication Sig Start Date End Date Taking? Authorizing Provider  acetaminophen (TYLENOL) 500 MG tablet Take 1,000 mg by mouth every 6 (six) hours as needed for moderate pain.    [provider]  albuterol (PROVENTIL HFA;VENTOLIN HFA) 108 (90 BASE) MCG/ACT inhaler Inhale 2 puffs into the lungs every 4 (four) hours as needed for wheezing. 07/12/12   Elson Areas, PA-C  amoxicillin-clavulanate (AUGMENTIN) 875-125 MG tablet Take 1 tablet by mouth every 12 (twelve) hours. 01/07/17   Hedges, Tinnie Gens, PA-C  bacitracin ointment Apply 1  application topically 2 (two) times daily. 09/25/14   Muthersbaugh, Dahlia Client, PA-C  benzonatate (TESSALON) 100 MG capsule Take 1 capsule (100 mg total) by mouth every 8 (eight) hours. Patient not taking: Reported on 02/09/2017 02/18/16   Jaynie Crumble, PA-C  cephALEXin (KEFLEX) 500 MG capsule Take 1 capsule (500 mg total) by mouth 4 (four) times daily. Patient not taking: Reported on 02/09/2017 09/25/14   Muthersbaugh, Dahlia Client, PA-C  fluticasone [facility]) 50 MCG/ACT nasal spray Place 1 spray into both nostrils daily. Patient not taking: Reported on 02/09/2017 01/07/17   Hedges, Tinnie Gens, PA-C  guaiFENesin-dextromethorphan (ROBITUSSIN DM) 100-10 MG/5ML syrup Take 5 mLs by mouth every 4 (four) hours as needed for cough. Patient not taking: Reported on 02/09/2017 02/18/16   Jaynie Crumble, PA-C  HYDROcodone-homatropine (HYCODAN) 5-1.5 MG/5ML syrup Take 5 mLs by mouth every 6 (six) hours as needed. Patient not taking: Reported on 10/04/2014 04/06/13   Emilia Beck, PA-C  ibuprofen (ADVIL,MOTRIN) 800 MG tablet Take 1 tablet (800 mg total) by mouth every 8 (eight) hours as needed for fever. 02/09/17   Ward, Chase Picket, PA-C  ipratropium (ATROVENT) 0.06 % nasal spray Place 2 sprays into both nostrils 4 (four) times daily. Patient not taking: Reported on 10/04/2014 12/21/13   Ozella Rocks, MD    Allergies    Patient has no known allergies.  Review of Systems  Review of Systems  Constitutional:  Negative for fever.  HENT:  Negative for sore throat.   Eyes:  Negative for discharge.  Gastrointestinal:  Positive for abdominal pain. Negative for rectal pain.  Genitourinary:  Positive for dysuria and penile discharge. Negative for frequency, genital sores, penile pain and testicular pain.  Musculoskeletal:  Negative for arthralgias.  Skin:  Negative for rash.  Hematological:  Negative for adenopathy.   Physical Exam Updated Vital Signs BP 131/77 (BP Location: Left Arm)   Pulse 92   Temp 98.6 F  (37 C) (Oral)   Resp 16   Ht 5\' 10"  (1.778 m)   Wt 78 kg   SpO2 99%   BMI 24.68 kg/m   Physical Exam Vitals and nursing note reviewed. Exam conducted with a chaperone present.  Constitutional:      General: He is not in acute distress.    Appearance: He is well-developed.  HENT:     Head: Normocephalic and atraumatic.  Eyes:     General:        Right eye: No discharge.        Left eye: No discharge.     Conjunctiva/sclera: Conjunctivae normal.  Cardiovascular:     Rate and Rhythm: Normal rate and regular rhythm.     Heart sounds: Normal heart sounds.  Pulmonary:     Effort: Pulmonary effort is normal.     Breath sounds: Normal breath sounds.  Abdominal:     Palpations: Abdomen is soft.     Tenderness: There is no abdominal tenderness.  Genitourinary:    Penis: Discharge (Scant yellow) present. No swelling or lesions.      Testes:        Right: Tenderness not present.        Left: Tenderness not present.  Musculoskeletal:     Cervical back: Normal range of motion and neck supple.  Skin:    General: Skin is warm and dry.  Neurological:     Mental Status: He is alert.    ED Results / Procedures / Treatments   Labs (all labs ordered are listed, but only abnormal results are displayed) Labs Reviewed  URINALYSIS, ROUTINE W REFLEX MICROSCOPIC  GC/CHLAMYDIA PROBE AMP ([place]) NOT AT [facility]    EKG None  Radiology No results found.  Procedures Procedures   Medications Ordered in ED Medications - No data to display  ED Course  I have reviewed the triage vital signs and the nursing notes.  Pertinent labs & imaging results that were available during my care of the patient were reviewed by me and considered in my medical decision making (see chart for details).  Patient seen and examined. Work-up initiated. Medications ordered.  Patient elects doxycycline over azithromycin after discussion of benefits of each.  Vital signs reviewed and are as follows: BP  131/77 (BP Location: Left Arm)   Pulse 92   Temp 98.6 F (37 C) (Oral)   Resp 16   Ht 5\' 10"  (1.778 m)   Wt 78 kg   SpO2 99%   BMI 24.68 kg/m   UA with white blood cells, consistent with urethritis.  No trichomonas noted.  Patient offered HIV and syphilis testing. Patient counseled on safe sexual practices. Told them that they should not have sexual contact for next 7 days and that they need to inform sexual partners so that they can get tested and treated as well. Patient verbalizes understanding and agrees with plan.  MDM Rules/Calculators/A&P                           Patient with clinical urethritis with risk factors.  Tested and treated as above.  Otherwise no issues.  Abdomen is soft and nontender on exam.   Final Clinical Impression(s) / ED Diagnoses Final diagnoses:  Urethritis    Rx / DC Orders ED Discharge Orders     None        Renne Crigler, PA-C 12/14/20 1643    Ernie Avena, MD 12/14/20 1725

## 2020-12-16 LAB — GC/CHLAMYDIA PROBE AMP (~~LOC~~) NOT AT ARMC
Chlamydia: NEGATIVE
Comment: NEGATIVE
Comment: NORMAL
Neisseria Gonorrhea: POSITIVE — AB

## 2022-08-25 ENCOUNTER — Emergency Department (HOSPITAL_COMMUNITY): Payer: PRIVATE HEALTH INSURANCE

## 2022-08-25 ENCOUNTER — Emergency Department (HOSPITAL_COMMUNITY)
Admission: EM | Admit: 2022-08-25 | Discharge: 2022-08-25 | Disposition: A | Discharge status: Home / Self Care | Payer: PRIVATE HEALTH INSURANCE | Attending: Emergency Medicine | Admitting: Emergency Medicine

## 2022-08-25 ENCOUNTER — Other Ambulatory Visit: Payer: Self-pay

## 2022-08-25 DIAGNOSIS — M5030 Other cervical disc degeneration, unspecified cervical region: Secondary | ICD-10-CM

## 2022-08-25 DIAGNOSIS — M542 Cervicalgia: Secondary | ICD-10-CM | POA: Insufficient documentation

## 2022-08-25 DIAGNOSIS — M47812 Spondylosis without myelopathy or radiculopathy, cervical region: Secondary | ICD-10-CM

## 2022-08-25 DIAGNOSIS — M62838 Other muscle spasm: Secondary | ICD-10-CM

## 2022-08-25 MED ORDER — CYCLOBENZAPRINE HCL 10 MG PO TABS: 5.0000 mg | ORAL_TABLET | Freq: Two times a day (BID) | ORAL | 0 refills | Status: AC | PRN

## 2022-08-25 MED ORDER — METHYLPREDNISOLONE 4 MG PO TBPK: ORAL_TABLET | ORAL | 0 refills | Status: AC

## 2022-08-25 MED ORDER — CELECOXIB 200 MG PO CAPS: 200.0000 mg | ORAL_CAPSULE | Freq: Two times a day (BID) | ORAL | 0 refills | Status: AC

## 2022-08-25 NOTE — ED Provider Notes (Signed)
EMERGENCY DEPARTMENT AT [facility] Provider Note   CSN: [id number] Arrival date & time: 08/25/22  1038     History  Chief Complaint  Patient presents with   Neck Pain    Stephen Warner is a 37 y.o. male , presents emergency department chief complaint of neck pain.  Patient reports midline and left-sided neck pain that began several months ago.  He states he thought it might have been from doing declined sit ups.  Patient states that it hurt really bad to move his neck but then went away.  He has had intermittent episodes of his neck hurting however this morning he woke up with severe pain in his neck inability to turn his neck.  He denies numbness tingling or weakness of the upper extremities.  He denies fevers chills or rash.  He has not had any imaging of his neck done in the past.  He denies known injury.   Neck Pain      Home Medications Prior to Admission medications   Medication Sig Start Date End Date Taking? Authorizing Provider  acetaminophen (TYLENOL) 500 MG tablet Take 1,000 mg by mouth every 6 (six) hours as needed for moderate pain.    [provider]  doxycycline (VIBRAMYCIN) 100 MG capsule Take 1 capsule (100 mg total) by mouth 2 (two) times daily. 12/14/20   Renne Crigler, PA-C      Allergies    Patient has no known allergies.    Review of Systems   Review of Systems  Musculoskeletal:  Positive for neck pain.    Physical Exam Updated Vital Signs BP 118/69 (BP Location: Left Arm)   Pulse 77   Temp 98.5 F (36.9 C) (Oral)   Resp 16   Ht 5\' 10"  (1.778 m)   Wt 76.7 kg   SpO2 100%   BMI 24.25 kg/m  Physical Exam Vitals and nursing note reviewed.  Constitutional:      General: He is not in acute distress.    Appearance: He is well-developed. He is not diaphoretic.  HENT:     Head: Normocephalic and atraumatic.  Eyes:     General: No scleral icterus.    Conjunctiva/sclera: Conjunctivae normal.  Neck:     Comments: Deep  wrist range of motion with flexion extension and left lateral rotation and left lateral flexion.  Range of motion also limited toward the right but less so.  There is midline spinal tenderness on examination.  Bilateral upper extremities are without sensory deficit and normal grip strength bilaterally. Cardiovascular:     Rate and Rhythm: Normal rate and regular rhythm.     Heart sounds: Normal heart sounds.  Pulmonary:     Effort: Pulmonary effort is normal. No respiratory distress.     Breath sounds: Normal breath sounds.  Abdominal:     Palpations: Abdomen is soft.     Tenderness: There is no abdominal tenderness.  Skin:    General: Skin is warm and dry.  Neurological:     Mental Status: He is alert.  Psychiatric:        Behavior: Behavior normal.     ED Results / Procedures / Treatments   Labs (all labs ordered are listed, but only abnormal results are displayed) Labs Reviewed - No data to display  EKG None  Radiology No results found.  Procedures Procedures    Medications Ordered in ED Medications - No data to display  ED Course/ Medical Decision Making/ A&P  Medical Decision Making Patient here with acute neck pain.  He does not appear to have evidence of unstable lig injury, myelopathy or radiculopathy. Suspect muscle spasm  Tx w/ medrol pack, flexeril and  celebex. Patient will be discharged with op f/u and return precautions     Amount and/or Complexity of Data Reviewed Radiology: ordered and independent interpretation performed.    Details: I personally visualized and interpreted the images using our PACS system. Acute findings include:  Ct cspine- spondylosis and bulging discs no cord compression or pathologic fractures   Risk Prescription drug management.           Final Clinical Impression(s) / ED Diagnoses Final diagnoses:  None    Rx / DC Orders ED Discharge Orders     None         Arthor Captain, PA-C 08/26/22 1240    Laurence Spates, MD 08/29/22 1606

## 2022-08-25 NOTE — Discharge Instructions (Signed)
You have pain that gets worse or does not get better. Get help right away if: You have severe back pain. Your ability to control your bowel or bladder changes. You develop weakness or numbness in your legs. You cannot stand or walk.

## 2022-08-25 NOTE — ED Triage Notes (Signed)
Pt reports 2-3 months of intermittent neck discomfort with worsening pain upon waking up, feeling very stiff with worsening pain on movement. Denies initial injury.

## 2023-06-06 ENCOUNTER — Emergency Department (HOSPITAL_COMMUNITY)
Admission: EM | Admit: 2023-06-06 | Discharge: 2023-06-07 | Disposition: A | Discharge status: Home / Self Care | Attending: Emergency Medicine | Admitting: Emergency Medicine

## 2023-06-06 ENCOUNTER — Other Ambulatory Visit: Payer: Self-pay

## 2023-06-06 DIAGNOSIS — Y92410 Unspecified street and highway as the place of occurrence of the external cause: Secondary | ICD-10-CM | POA: Diagnosis not present

## 2023-06-06 DIAGNOSIS — S0181XA Laceration without foreign body of other part of head, initial encounter: Secondary | ICD-10-CM | POA: Insufficient documentation
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
## 2023-06-07 MED ORDER — DOXYCYCLINE HYCLATE 100 MG PO TABS
100.0000 mg | ORAL_TABLET | Freq: Once | ORAL | Status: AC
  Administered 2023-06-07: 100 mg via ORAL
  Filled 2023-06-07: qty 1

## 2023-06-07 MED ORDER — LIDOCAINE-EPINEPHRINE 2 %-1:100000 IJ SOLN
20.0000 mL | Freq: Once | INTRAMUSCULAR | Status: AC
  Administered 2023-06-07: 20 mL
  Filled 2023-06-07: qty 1

## 2023-06-07 MED ORDER — DOXYCYCLINE HYCLATE 100 MG PO CAPS: 100.0000 mg | ORAL_CAPSULE | Freq: Two times a day (BID) | ORAL | 0 refills | Status: AC
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
## 2023-06-07 NOTE — Discharge Instructions (Signed)
 Sutures should dissolve, they do not need to be taken out.  If you get increased pain, swelling, redness or drainage from the wound, return to the emergency department.
